# Patient Record
Sex: Male | Born: 1980 | Race: White | Hispanic: No | Marital: Single | Smoking: Former smoker
Health system: Southern US, Community
[De-identification: ages and names within clinical notes are randomized; demographics above are authoritative.]

## PROBLEM LIST (undated history)

## (undated) DIAGNOSIS — F1020 Alcohol dependence, uncomplicated: Secondary | ICD-10-CM

## (undated) DIAGNOSIS — I10 Essential (primary) hypertension: Secondary | ICD-10-CM

## (undated) DIAGNOSIS — F32A Depression, unspecified: Secondary | ICD-10-CM

## (undated) DIAGNOSIS — S069X9A Unspecified intracranial injury with loss of consciousness of unspecified duration, initial encounter: Secondary | ICD-10-CM

## (undated) DIAGNOSIS — F329 Major depressive disorder, single episode, unspecified: Secondary | ICD-10-CM

## (undated) HISTORY — PX: SPINE SURGERY: SHX786

## (undated) HISTORY — DX: Essential (primary) hypertension: I10

## (undated) HISTORY — DX: Major depressive disorder, single episode, unspecified: F32.9

## (undated) HISTORY — DX: Depression, unspecified: F32.A

## (undated) HISTORY — DX: Unspecified intracranial injury with loss of consciousness of unspecified duration, initial encounter: S06.9X9A

## (undated) HISTORY — PX: CERVICAL SPINE SURGERY: SHX589

---

## 2002-09-03 ENCOUNTER — Encounter: Payer: Self-pay | Admitting: Emergency Medicine

## 2002-09-03 ENCOUNTER — Emergency Department (HOSPITAL_COMMUNITY): Admission: EM | Admit: 2002-09-03 | Discharge: 2002-09-03 | Payer: Self-pay | Admitting: Emergency Medicine

## 2002-09-09 ENCOUNTER — Ambulatory Visit (HOSPITAL_COMMUNITY): Admission: RE | Admit: 2002-09-09 | Discharge: 2002-09-09 | Payer: Self-pay | Admitting: Neurology

## 2002-10-27 ENCOUNTER — Encounter: Payer: Self-pay | Admitting: Emergency Medicine

## 2002-10-27 ENCOUNTER — Emergency Department (HOSPITAL_COMMUNITY): Admission: EM | Admit: 2002-10-27 | Discharge: 2002-10-28 | Payer: Self-pay | Admitting: Emergency Medicine

## 2003-07-25 DIAGNOSIS — S069X9A Unspecified intracranial injury with loss of consciousness of unspecified duration, initial encounter: Secondary | ICD-10-CM

## 2003-07-25 DIAGNOSIS — S069XAA Unspecified intracranial injury with loss of consciousness status unknown, initial encounter: Secondary | ICD-10-CM

## 2003-07-25 HISTORY — DX: Unspecified intracranial injury with loss of consciousness of unspecified duration, initial encounter: S06.9X9A

## 2003-07-25 HISTORY — DX: Unspecified intracranial injury with loss of consciousness status unknown, initial encounter: S06.9XAA

## 2003-08-08 ENCOUNTER — Inpatient Hospital Stay (HOSPITAL_COMMUNITY): Admission: AC | Admit: 2003-08-08 | Discharge: 2003-09-21 | Payer: Self-pay

## 2003-09-21 ENCOUNTER — Inpatient Hospital Stay (HOSPITAL_COMMUNITY)
Admission: RE | Admit: 2003-09-21 | Discharge: 2003-11-20 | Payer: Self-pay | Admitting: Physical Medicine & Rehabilitation

## 2003-11-24 ENCOUNTER — Encounter
Admission: RE | Admit: 2003-11-24 | Discharge: 2004-02-22 | Payer: Self-pay | Admitting: Physical Medicine & Rehabilitation

## 2003-12-03 ENCOUNTER — Ambulatory Visit (HOSPITAL_COMMUNITY)
Admission: RE | Admit: 2003-12-03 | Discharge: 2003-12-03 | Payer: Self-pay | Admitting: Physical Medicine & Rehabilitation

## 2003-12-11 ENCOUNTER — Ambulatory Visit (HOSPITAL_COMMUNITY)
Admission: RE | Admit: 2003-12-11 | Discharge: 2003-12-11 | Payer: Self-pay | Admitting: Physical Medicine & Rehabilitation

## 2003-12-25 ENCOUNTER — Encounter
Admission: RE | Admit: 2003-12-25 | Discharge: 2004-03-24 | Payer: Self-pay | Admitting: Physical Medicine & Rehabilitation

## 2004-02-02 ENCOUNTER — Ambulatory Visit (HOSPITAL_COMMUNITY)
Admission: RE | Admit: 2004-02-02 | Discharge: 2004-02-02 | Payer: Self-pay | Admitting: Physical Medicine & Rehabilitation

## 2004-02-12 ENCOUNTER — Ambulatory Visit (HOSPITAL_COMMUNITY)
Admission: RE | Admit: 2004-02-12 | Discharge: 2004-02-12 | Payer: Self-pay | Admitting: Physical Medicine & Rehabilitation

## 2004-02-23 ENCOUNTER — Encounter
Admission: RE | Admit: 2004-02-23 | Discharge: 2004-02-25 | Payer: Self-pay | Admitting: Physical Medicine & Rehabilitation

## 2004-04-07 ENCOUNTER — Encounter
Admission: RE | Admit: 2004-04-07 | Discharge: 2004-06-01 | Payer: Self-pay | Admitting: Physical Medicine & Rehabilitation

## 2004-04-08 ENCOUNTER — Ambulatory Visit: Payer: Self-pay | Admitting: Physical Medicine & Rehabilitation

## 2004-06-01 ENCOUNTER — Encounter
Admission: RE | Admit: 2004-06-01 | Discharge: 2004-08-30 | Payer: Self-pay | Admitting: Physical Medicine & Rehabilitation

## 2004-06-10 ENCOUNTER — Ambulatory Visit (HOSPITAL_COMMUNITY): Admission: RE | Admit: 2004-06-10 | Discharge: 2004-06-10 | Payer: Self-pay | Admitting: Orthopedic Surgery

## 2004-06-24 ENCOUNTER — Encounter: Admission: RE | Admit: 2004-06-24 | Discharge: 2004-09-22 | Payer: Self-pay | Admitting: Orthopedic Surgery

## 2004-08-05 ENCOUNTER — Ambulatory Visit: Payer: Self-pay | Admitting: Physical Medicine & Rehabilitation

## 2004-09-29 ENCOUNTER — Encounter
Admission: RE | Admit: 2004-09-29 | Discharge: 2004-12-28 | Payer: Self-pay | Admitting: Physical Medicine & Rehabilitation

## 2004-10-03 ENCOUNTER — Ambulatory Visit: Payer: Self-pay | Admitting: Physical Medicine & Rehabilitation
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
## 2005-02-06 ENCOUNTER — Encounter
Admission: RE | Admit: 2005-02-06 | Discharge: 2005-05-07 | Payer: Self-pay | Admitting: Physical Medicine & Rehabilitation

## 2005-02-06 ENCOUNTER — Ambulatory Visit: Payer: Self-pay | Admitting: Physical Medicine & Rehabilitation

## 2005-05-08 ENCOUNTER — Encounter
Admission: RE | Admit: 2005-05-08 | Discharge: 2005-08-06 | Payer: Self-pay | Admitting: Physical Medicine & Rehabilitation

## 2005-05-08 ENCOUNTER — Ambulatory Visit: Payer: Self-pay | Admitting: Physical Medicine & Rehabilitation

## 2005-08-01 ENCOUNTER — Ambulatory Visit: Payer: Self-pay | Admitting: Physical Medicine & Rehabilitation

## 2005-11-27 ENCOUNTER — Encounter
Admission: RE | Admit: 2005-11-27 | Discharge: 2006-02-25 | Payer: Self-pay | Admitting: Physical Medicine & Rehabilitation

## 2005-12-01 ENCOUNTER — Ambulatory Visit: Payer: Self-pay | Admitting: Physical Medicine & Rehabilitation

## 2006-02-18 ENCOUNTER — Emergency Department (HOSPITAL_COMMUNITY): Admission: EM | Admit: 2006-02-18 | Discharge: 2006-02-18 | Payer: Self-pay | Admitting: Emergency Medicine

## 2006-05-21 ENCOUNTER — Encounter
Admission: RE | Admit: 2006-05-21 | Discharge: 2006-08-19 | Payer: Self-pay | Admitting: Physical Medicine & Rehabilitation

## 2006-05-21 ENCOUNTER — Ambulatory Visit: Payer: Self-pay | Admitting: Physical Medicine & Rehabilitation

## 2006-08-27 ENCOUNTER — Encounter
Admission: RE | Admit: 2006-08-27 | Discharge: 2006-11-25 | Payer: Self-pay | Admitting: Physical Medicine & Rehabilitation

## 2006-08-27 ENCOUNTER — Ambulatory Visit: Payer: Self-pay | Admitting: Psychology

## 2006-11-02 ENCOUNTER — Encounter
Admission: RE | Admit: 2006-11-02 | Discharge: 2007-01-31 | Payer: Self-pay | Admitting: Physical Medicine & Rehabilitation

## 2006-11-28 ENCOUNTER — Ambulatory Visit: Payer: Self-pay | Admitting: Physical Medicine & Rehabilitation

## 2008-01-16 ENCOUNTER — Encounter
Admission: RE | Admit: 2008-01-16 | Discharge: 2008-01-16 | Payer: Self-pay | Admitting: Physical Medicine & Rehabilitation

## 2009-04-05 ENCOUNTER — Encounter
Admission: RE | Admit: 2009-04-05 | Discharge: 2009-04-06 | Payer: Self-pay | Admitting: Physical Medicine & Rehabilitation

## 2009-04-06 ENCOUNTER — Ambulatory Visit: Payer: Self-pay | Admitting: Physical Medicine & Rehabilitation

## 2010-02-28 ENCOUNTER — Emergency Department (HOSPITAL_COMMUNITY): Admission: EM | Admit: 2010-02-28 | Discharge: 2010-02-28 | Payer: Self-pay | Admitting: Family Medicine

## 2010-03-14 ENCOUNTER — Emergency Department (HOSPITAL_COMMUNITY): Admission: EM | Admit: 2010-03-14 | Discharge: 2010-03-14 | Payer: Self-pay | Admitting: Family Medicine

## 2010-06-07 ENCOUNTER — Emergency Department (HOSPITAL_COMMUNITY)
Admission: EM | Admit: 2010-06-07 | Discharge: 2010-06-07 | Payer: Self-pay | Source: Home / Self Care | Admitting: Emergency Medicine

## 2010-06-17 ENCOUNTER — Emergency Department (HOSPITAL_COMMUNITY): Admission: EM | Admit: 2010-06-17 | Discharge: 2010-06-17 | Payer: Self-pay | Admitting: Family Medicine

## 2010-07-12 ENCOUNTER — Emergency Department (HOSPITAL_COMMUNITY)
Admission: EM | Admit: 2010-07-12 | Discharge: 2010-07-12 | Payer: Self-pay | Source: Home / Self Care | Admitting: Emergency Medicine

## 2010-07-17 ENCOUNTER — Emergency Department (HOSPITAL_COMMUNITY)
Admission: EM | Admit: 2010-07-17 | Discharge: 2010-07-17 | Payer: Self-pay | Source: Home / Self Care | Admitting: Emergency Medicine

## 2010-07-20 ENCOUNTER — Emergency Department (HOSPITAL_COMMUNITY)
Admission: EM | Admit: 2010-07-20 | Discharge: 2010-07-20 | Payer: Self-pay | Source: Home / Self Care | Admitting: Emergency Medicine

## 2010-08-13 ENCOUNTER — Emergency Department (HOSPITAL_COMMUNITY)
Admission: EM | Admit: 2010-08-13 | Discharge: 2010-08-13 | Payer: Self-pay | Source: Home / Self Care | Admitting: Emergency Medicine

## 2010-08-14 ENCOUNTER — Encounter: Payer: Self-pay | Admitting: Physical Medicine & Rehabilitation

## 2010-08-28 ENCOUNTER — Emergency Department (HOSPITAL_COMMUNITY)
Admission: EM | Admit: 2010-08-28 | Discharge: 2010-08-28 | Disposition: A | Discharge status: Home / Self Care | Payer: Medicare Other | Attending: Emergency Medicine | Admitting: Emergency Medicine

## 2010-08-28 DIAGNOSIS — G8929 Other chronic pain: Secondary | ICD-10-CM | POA: Insufficient documentation

## 2010-08-28 DIAGNOSIS — M79609 Pain in unspecified limb: Secondary | ICD-10-CM | POA: Insufficient documentation

## 2010-08-28 DIAGNOSIS — Z87820 Personal history of traumatic brain injury: Secondary | ICD-10-CM | POA: Insufficient documentation

## 2010-08-28 DIAGNOSIS — F329 Major depressive disorder, single episode, unspecified: Secondary | ICD-10-CM | POA: Insufficient documentation

## 2010-08-28 DIAGNOSIS — M549 Dorsalgia, unspecified: Secondary | ICD-10-CM | POA: Insufficient documentation

## 2010-08-28 DIAGNOSIS — F3289 Other specified depressive episodes: Secondary | ICD-10-CM | POA: Insufficient documentation

## 2010-10-26 ENCOUNTER — Encounter: Payer: Medicare Other | Attending: Physical Medicine & Rehabilitation | Admitting: Physical Medicine & Rehabilitation

## 2010-10-26 DIAGNOSIS — S069XAA Unspecified intracranial injury with loss of consciousness status unknown, initial encounter: Secondary | ICD-10-CM | POA: Insufficient documentation

## 2010-10-26 DIAGNOSIS — S069X9A Unspecified intracranial injury with loss of consciousness of unspecified duration, initial encounter: Secondary | ICD-10-CM

## 2010-10-26 DIAGNOSIS — X58XXXA Exposure to other specified factors, initial encounter: Secondary | ICD-10-CM | POA: Insufficient documentation

## 2010-10-27 NOTE — Assessment & Plan Note (Addendum)
Troy [name] is back regarding his brain injury and more specifically about the paperwork from the [facility].  He has been doing well for the most part.  He is not working any longer, but is working on finding new employment. His marriage did not last unfortunately.  He has been completely functional from a cognitive and emotional standpoint.  He is walking and moving without any restrictions.  Denies any pain.  REVIEW OF SYSTEMS:  In the written health and history section of the chart.  SOCIAL HISTORY:  The patient is single, currently living with parents.  PHYSICAL EXAMINATION:  VITAL SIGNS:  Blood pressure is 114/76, pulse 72, respiratory rate 18, he is satting 98% on room air. GENERAL:  The patient is pleasant, alert, and oriented x3. NEUROLOGIC:  Gait is stable and improved from a standpoint of width and stride length.  Fine motor coordination is near normal.  Cranial nerve exam is intact.  Voice remains somewhat nasal. HEART:  Regular. CHEST:  Clear. ABDOMEN:  Soft, nontender.  ASSESSMENT:  Status post traumatic brain injury with nice recovery.  PLAN:  I filled out the patient's DMV paperwork today, but this form is no longer indicated in my opinion.  We will see him back as needed.     Ranelle Oyster, M.D. Electronically Signed    ZTS/MedQ D:  10/26/2010 10:51:59  T:  10/27/2010 00:54:56  Job #:  [id number]

## 2010-12-09 NOTE — Assessment & Plan Note (Signed)
MEDICAL RECORD NUMBER:  [id number].   Troy [name] is back regarding his traumatic brain injury.  He has begun some work  at [employer].  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  He is  apparently getting little feedback from his job coach or the store.  Patient  still is having some difficulties with balance.  He feels a bit uneven  especially on difficult to navigate surfaces.  Sleep is better with the  trazodone.  Still having some problems with memory and attention.  Particularly, he complains of poor attention today.  He was deemed mentally  competent by the court.  He is awaiting word on his driving ability.   REVIEW OF SYSTEMS:  Patient reports weakness, tremor, trouble walking,  spasms, depression which is more of an apathy then true depression.  He has  occasional shortness of breath.  He is not on a true exercise program.  He  is at a half-way house currently and doing well.  He has completed the  requirements of his drug rehab.   SOCIAL HISTORY:  Pertinent positives listed above.   PHYSICAL EXAMINATION:  GENERAL APPEARANCE:  Patient is pleasant and in no  acute distress.  He is alert and oriented x3.  Affect generally bright.  VITAL SIGNS:  Blood pressure 125/80, pulse 104, respiratory rate 16, sating  100% on room air.  HEENT:  Voice remains nasal in quality but is very intelligible.  CARDIOVASCULAR:  Regular rhythm.  CHEST:  Clear.  NEUROLOGIC:  Gait is improved with better coordination of the left side.  His hips tend to jerk a certain extent, but this is the most fluid I have  seen him yet.  Coordination is fair.  Reflexes are 2++.  Sensation is  normal.  He has some problems with his visual acuity in close range but is  intact when viewing from  afar.  Memory and attention were fair today.  Motor  skills were near 5/5 both upper and lower extremities.   ASSESSMENT:  1.  Traumatic brain injury.  2.  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  3.  History of polysubstance abuse.  4.  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      Patient sees better with prism lenses.  5.  Insomnia.  6.  Depression.   PLAN:  1.  I would like to see [place] continue with his job work.  I would be happy      to speak with store if there are any issues with them understanding his      injury.  2.  Await clearance from the court to driving. I think I would prefer him      taking a driving test at [facility] before he goes      behind the wheel.  3.  Continue Effexor 150 mg daily.  4.  He may continue low dose trazodone 25 to 50 mg nightly for rest.  5.  For poor attention, will begin Strattera 40 mg daily.  6.  Will see the patient back in about one month's time to assess progress.      We did discuss appropriate exercise in  training regimen which could include pool, walking and swimming as well      as treadmill and Nautilus work-out.      Ranelle Oyster, M.D.  Electronically Signed     ZTS/MedQ  D:  05/08/2005 14:52:12  T:  05/08/2005 16:28:50  Job #:  [id number]
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
## 2010-12-09 NOTE — Assessment & Plan Note (Signed)
MEDICAL RECORD #[id number]   Troy [name] is here in follow-up of his traumatic brain injury. He continues to  progress. He is in a group home currently. He went through another course of  rehab over the summer with fair results. 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  He is not driving at this point and he is awaiting permission to drive from  the courts. The patient has some pain in the low back due to chronic bending  at work which usually resolves on his days off. He will work anywhere from  15-20+ hours a week. We tried to increase his Effexor last visit and he had  some problems tolerating this. His family doctor increased it again a few  weeks back. He seems to be doing pretty well now. He is using 50-100 mg of  trazodone at nighttime for sleep as well.   REVIEW OF SYSTEMS:  The patient reports problems with mood and walking, as  well as occasional chills. Full review of systems is in the health and  history section of the chart.   SOCIAL HISTORY:  The patient is single. He was in alcohol rehab for 28 days.  He is planning on moving into an apartment soon once he can drive and get  back to normal work. 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   PHYSICAL EXAMINATION:  VITAL SIGNS:  Blood pressure is 121/67, pulse is 79,  respiratory rate is 16, he is saturating 100% on room air.  GENERAL:  The patient is pleasant, no acute distress. He is alert and  oriented x3. His affect is appropriate and gait slightly wide-based with  some favoring of the right leg. Coordination is fair. Reflexes are 2+.  Sensation is normal. He has a nasal voice still but is intelligible. Mood is  generally appropriate. Strength is near 5/5 bilaterally.  SKIN:  Intact.  HEART:  Regular rate and rhythm.  LUNGS:  Clear.  ABDOMEN:  Soft and nontender.  EXTREMITIES:  Show no clubbing, cyanosis, or edema.   ASSESSMENT:  1.  Traumatic brain injury.  2.  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  3.  History of polysubstance abuse.  4.  Diplopia and cranial nerve III palsy.  5.  Insomnia.  6.  Depression.   PLAN:  1.  Continue transitional work at this point. 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 I will be happy to      help him in any way we can there.  2.  Once he is cleared from a legal standpoint and by Dr. Karleen Hampshire visually      for driving, will send him for driving testing. If he passes that, he      will resume driving.  3.  Continue Effexor 150 mg daily.  4.  Decrease trazodone to off.  5.  I will see the patient back in about 3 months' time. He continues to      progress nicely.       ZTS/MedQ  D:  02/08/2005 12:36:24  T:  02/08/2005 14:41:38  Job #:  [id number]

## 2010-12-09 NOTE — Assessment & Plan Note (Signed)
CHIEF COMPLAINT:  Status post history of traumatic brain injury.   He continues to do very well.  He has come off the Strattera at times  without any noticeable changes.  He is still taking it as prescribed,  but has run out of it on a few occasions, and noticed no difference.  He  is using trazodone for sleep as well as the Effexor.  He works part time  as a Financial controller and attends his [employer] regularly.  He denies any  pain.  His mood has been excellent.  No new changes are noted today.  He  recently had neuropsychological testing done through Dr. Leonides Cave and did  very well.   REVIEW OF SYMPTOMS:  Notable for occasional spasms.  Otherwise,  pertinent positives listed above, full review in the written health and  history section of the chart.   SOCIAL HISTORY:  The patient lives alone and is single.  He smokes one  pack of cigarettes per day.   PHYSICAL EXAMINATION:  VITAL SIGNS:  Blood pressure 131/66, pulse 92,  respirations 16 and saturations 100% on room air.  GENERAL:  The patient is pleasant, alert and oriented x3.  Affect is  bright and appropriate.  NEUROLOGIC:  Gait is slightly wide based and a bit choppy still, but  improved.  Coordination is fair.  Reflexes are 2+.  Sensation is good.  Cognitively the patient is appropriate with good insight, awareness,  memory, attention, decision making function, etc.  Cranial nerve exam is  intact, although the patient's voice remains a bit nasal still.  HEART:  Regular rate.  CHEST:  Clear.  ABDOMEN:  Soft and nontender.   ASSESSMENT:  1. Status post traumatic brain injury.  2. Cognitive deficits secondary to number one.  3. History of poly substance abuse.  4. History of diplopia.  5. Insomnia.  6. Depression.   PLAN:  1. Continue Effexor 150 mg q.day for now.  2. Stop Strattera.  3. Taper off trazodone over the next few weeks to months time.  May      benefit from over the counter supplements such as melatonin.  4. I  will see the patient back on an as needed basis in the future.      He has done a very job through this long ordeal.      Ranelle Oyster, M.D.  Electronically Signed     ZTS/MedQ  D:  11/30/2006 11:24:47  T:  11/30/2006 11:57:33  Job #:  [id number]
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
## 2010-12-09 NOTE — Assessment & Plan Note (Signed)
DATE OF VISIT:  October 03, 2004.   MEDICAL RECORD NUMBER:  [id number].   Troy [name] is back regarding his traumatic brain injury.  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  He is bored with this job already.  Sleep has been fair.  He is off of the Ritalin and doing fairly well.  The  patient saw Dr. Karleen Hampshire, who felt that he had a cranial nerve III palsy.  I  do not have the results of his visit in the chart at this point.  Plastic  surgery recommended conservative management at this point for his scalp.  His Effexor has been stable at 75 mg daily.  Troy [name] word from the court  regarding reinstatement of his competency.  He apparently needs a letter  from myself and another doctor in his favor.  His OT driving evaluation  pends this court decision.  I had a chance to review the neuropsychological  testing done through disability services.  The patient tested out at  borderline intelligence level.  He had more problems with his performance  testing more so than verbal.  He also had more problems with visual memory  than auditory.  I explained the results to a certain extent with the patient  and his mother.  The patient overall is feeling fairly well at this point.  His mother asked about whether he may be appropriate to live on his own  sometime soon.   REVIEW OF SYSTEMS:  Pertinent positives are listed above.  No changes  reported.   SOCIAL HISTORY:  Noted above as well.   PHYSICAL EXAMINATION:  VITAL SIGNS:  The blood pressure is 117/75, the pulse  is 84 and the respiratory rate is 16.  He is saturating 99% on room air.  GENERAL APPEARANCE:  The patient has a slightly wide-based gait.  He tends  to kick the legs out when he walks.  When I cued him to bring the legs  closer in together, he did very nicely with his gait appearance and  efficiency.  The patient had some difficulty walking heel to toe.  Affect  was bright and more demonstrative today.  He made good eye  contact.  The  appearance of his eyes was more appropriate as well.  The patient was well  kept.  HEENT:  The skin is healing nicely on the forehead.  NEUROLOGIC:  The patient has good strength in both upper extremities.  A  minimal tremor was noted today.  The patient was able to ask appropriate  questions.  He did need some cues at times to recall information.  HEART:  Regular rate and rhythm.  CHEST:  Clear.  ABDOMEN:  Soft and nontender.  EXTREMITIES:  No cyanosis, clubbing, or edema.   ASSESSMENT:  1.  Traumatic brain injury.  2.  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  3.  History of polysubstance abuse.  4.  Diplopia and cranial nerve III palsy.  5.  Insomnia.  6.  Depression.   PLAN:  1.  Troy [name] has done very well to this point.  We discussed once again that      this will be a slow process.  I would like to see him work on some of      his vocational skills.  He is not appropriate to be gainfully employed      at this time.  I do think he is appropriate for long-term disability.  2.  Could consider followup neuropsychological testing next fall.  I would      not recommend that Troy [name] try school at this point as it may be      counterproductive.  3.  I did recommend that Troy [name] try living on his own if that is financially a      possibility.  4.  Recommend that he follow up with Dr. Karleen Hampshire.  5.  Will increase the Effexor to 150 mg daily to help with arousal and mood.      Troy [name] has been a bit frustrated with his living arrangements and lack of      friends and contact with females.  6.  Continue trazodone 100-200 mg q.h.s. for sleep.  7.  I will see Troy [name] back in about three months' time.      ZTS/MedQ  D:  10/03/2004 16:13:19  T:  10/03/2004 18:46:08  Job #:  [id number]
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
## 2010-12-09 NOTE — Op Note (Signed)
[name], Troy                  ACCOUNT NO.:  [id number]   MEDICAL RECORD NO.:  [id number]          PATIENT TYPE:  OIB   LOCATION:  2550                         FACILITY:  [facility]   PHYSICIAN:  Leonides Grills, M.D.     DATE OF BIRTH:  [birth year]   DATE OF PROCEDURE:  06/10/2004  DATE OF DISCHARGE:  06/10/2004                                 OPERATIVE REPORT   PREOPERATIVE DIAGNOSIS:  1.  Left medial talar dome osteochondral lesion.  2.  Left anterolateral ankle impingement.   POSTOPERATIVE DIAGNOSIS:  1.  Left medial talar dome osteochondral lesion.  2.  Left anterolateral ankle impingement.   OPERATION:  1.  Left ankle arthroscopy with extensive debridement.  2.  Left medial talar dome osteochondral lesion debridement and drilling.   ANESTHESIA:  General endotracheal anesthesia.   SURGEON:  Leonides Grills, M.D.   ASSISTANT:  Lianne Cure, P.A.-C.   ESTIMATED BLOOD LOSS:  Minimal.   TOURNIQUET TIME:  None.   COMPLICATIONS:  None.   DISPOSITION:  Stable to the PR.   INDICATIONS FOR PROCEDURE:  This is a 30 year old male who has had  persistent ankle pain that is interfering with his life to the point he  cannot do what he wants to do.  He has consented for the above procedure.  All risks which included infection, neurovascular injury, persistent pain,  worsening pain, stiffness, arthritis, and recurrence of mechanical symptoms  were all explained.  Questions were encouraged and answered.   OPERATION:  The patient was brought to the operating room and placed in  supine position.  After adequate general endotracheal anesthesia was  administered as well as Ancef 1 gram IV piggyback, the left lower extremity  was then prepped and draped in a sterile manner.  No tourniquet was used.  The anatomical landmarks were mapped out.  A spinal needle was placed medial  to the anterior tibialis tendon.  20 mL of normal saline was instilled into  the ankle.  A nick and spread  technique was utilized to create the  anteromedial portal just medial to the anterior tibialis tendon.  The blunt  tip trocar with the cannula followed by camera was then placed into the  ankle under direct visualization.  A spinal needle was placed just lateral  to the superficial peroneal nerve which was mapped out.  A nick and spread  technique was utilized to create the anterolateral portal.  There was a  large amount of synovitis in the ankle.  The accessory tib-fib ligament is  rubbing the anterolateral corner of the talar dome and there was local  synovitis in this area, as well, and this extended anteriorly and medially.  There was also a tremendous amount of synovitis anteromedially.  Using a  bevel as well as a shaver, this was then extensively debrided.  Pictures  were obtained throughout this procedure.  The talar dome osteochondral  lesion has a significant soft spot on the central third of the medial talar  dome, this was then curetted out.  There was a cyst underneath this area.  This was then debrided, as well.  Microfracture was used to create holes in  the base.  Inflow and pressure was deflated, there was excellent bleeding  from the base.  The camera was removed, pictures were taken throughout the  procedure.  The wounds were closed with 4-0 nylon.  A sterile dressing was  applied, CAM walker boot was applied, the patient was stable to the PR.      Paul   PB/MEDQ  D:  06/10/2004  T:  06/10/2004  Job:  [id number]
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
## 2010-12-09 NOTE — Op Note (Signed)
Troy [name], Troy [name]                              ACCOUNT NO.:  [id number]   MEDICAL RECORD NO.:  [id number]                   PATIENT TYPE:  INP   LOCATION:  3111                                 FACILITY:  [facility]   PHYSICIAN:  Jimmye Norman III, M.D.               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   PROCEDURE:  1. Esophagogastroduodenoscopy.  2. Percutaneous endoscopic gastrostomy.  3. Bronchoscopy with lavage.  4. Percutaneous tracheostomy with #8 Shiley tracheostomy tube.   SURGEON:  Jimmye Norman, M.D.   ASSISTANT:  Phineas Semen, P.A.   ANESTHESIA:  Done at bedside with IV Fentanyl 200 mcg and Versed 10 mg along  with 20 mg of IV Norcuron.   CONDITION:  Stable; it was done at the bedside in Neuro ICU.   INDICATIONS FOR PROCEDURE:  The patient is an unfortunate 30 year old with a  severe closed head injury who requires prolonged ventilatory and nutritional  support.   DESCRIPTION OF PROCEDURE:  The percutaneous endoscopic gastrostomy tube was  done first along with the esophagogastroduodenoscopy.  Using the Olympus  GIF160 scope, we cannulated the patient's oropharynx into the proximal  esophagus where pictures were taken of what appeared to be a normal  esophagus without evidence of esophagitis.  As we passed into the stomach  and near the pylorus there was some what appeared to be healed superficial  ulcerations.  The NG tube was still in place which was pulled out during the  procedure.   We cannulated the pylorus into the first and second portion of the duodenum  where normal mucosa was seen without evidence of duodenal ulceration or  bleeding.  As we  pulled back into the pylorus and the stomach in the antrum,  that area appeared to be slightly atrophic and maybe edematous with some  healed scars.   We pulled the scope back through the proximal stomach where upon palpating  the anterior abdominal wall we were able to see the Impulse and pass a 14  gauge Angiocath into the stomach under direct vision.  It was through the  Angiocath that we grasped a loop of wire which was brought through the  patient's mouth and subsequently looped around the port through the  gastrostomy tube which was subsequently pulled back through the patient's  mouth, out the anterior abdominal wall and secured in place.  Pictures were  taken of its position subsequent to placement.   After it was secured and placed, the patient was prepped for a bronchoscopy.   A towel roll was placed underneath the patient's shoulder. We did remove the  anterior  portion of his [place] J collar.  With the patient oxygenated on  100%, we bronchoscoped him and found there to be copious thick secretions on  both sides of the lung.  Mucomyst irrigation and bronchioalveolar lavage  were performed using saline.  We did not send specimens as we have known we  had been treating an MRSA pneumonia.  This appeared to be the same.  We  aspirated large amounts of pus and subsequently removed the scope. We then  prepped him with Betadine in the usual sterile manner for the tracheostomy  tube.   We anesthetized the area of about 1 cm above the sternal notch using 1%  Xylocaine with a 25 gauge needle. We made a transverse incision about 2.0 to  2.5 to 3.0 cm long down to the subcutaneous tissue and then mostly used  electrocautery and blunt dissection with the hemostat clamp to dissect down  to the pretracheal fascia at the level of the first, second and third  tracheal rings.  Once we had adequate exposure in that area with [employer]  retractors in place, we punctured between the second and  third tracheal  rings using an Angiocath, 14 gauge and then subsequently passed a green wire  from the blue Rhino kit into the tracheotomy.  We enlarged it with a short  rigid dilator and then subsequently the large blue Rhino dilator up to the  black line of the serial dilator. We subsequently removed that and used a 28  French introducer and dilator inside of a #8 Shiley tube and then passed  that into the tracheotomy with minimal difficulty.  A __________ cannula was  placed and the ballon of the tracheostomy tube was inflated which  subsequently demonstrated there was an excellent flow of gas and good  return.   We secured the tracheostomy tube in place using four corner stitches of 3-0  nylon with the dressing in place.  Once this was done we let the patient  wake up from the procedure, removed all the devices including the ET tube  from his oropharynx.                                               Kathrin Ruddy, M.D.    JW/MEDQ  D:  08/19/2003  T:  08/19/2003  Job:  [id number]

## 2010-12-09 NOTE — Consult Note (Signed)
Troy [name], Troy [name]                            ACCOUNT NO.:  [id number]   MEDICAL RECORD NO.:  [id number]                   PATIENT TYPE:  EMS   LOCATION:  MAJO                                 FACILITY:  [facility]   PHYSICIAN:  Genene Churn. Love, M.D.                 DATE OF BIRTH:  1980-07-25   DATE OF CONSULTATION:  09/03/2002  DATE OF DISCHARGE:                                   CONSULTATION   REASON FOR CONSULTATION:  This 30 year old right-handed white single male,  who lives with his mother and father, is seen in the emergency room for  evaluation of a seizure.   HISTORY OF PRESENT ILLNESS:  The patient was the 7 pound 11-1/2 ounce  product of full-term pregnancy, delivered by C-section.  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  His motor and cognitive  developed milestones were normal.  Prior to age 44, he had two witnessed less  than two minute generalized major motor seizures occurring with fevers.  There is no positive family history of seizures.  He subsequently has had no  seizures until this day.  He had eaten some cinnamon bun for breakfast and  was at a [employer] waiting for his car to be finished with its lubrication.  He had taken his Levaquin medicine given for left arm cellulitis and noted  nausea and vomiting.  He threw up in the restroom.  He went back to the  waiting room and returned to the couch.  He lost consciousness without aura.  He had a seizure, was shaking all over for two to four minutes, falling  forward without hurting himself.  He was confused afterwards.   The EMT service arrived.  He had a normal glucose and he refused to go to  the emergency room but his mother subsequently came and he was brought to  the emergency room.  He had three episodes of emesis afterwards and also had  some bifrontal headache.   In the emergency room, his headache resolved.  CT scan of the brain was  obtained without contrast which was normal and  sodium was 143, potassium  3.8, chloride 105, CO2 27, glucose 198, hemoglobin 13, and hematocrit 53.  An EKG was normal.  A pH was 7.286, pCO2 of 52.2.   PAST MEDICAL HISTORY:  His past history is significant for recently  diagnosed hypertension, anxiety, and depression.   MEDICATIONS:  1. Atenolol.  2. Klonopin 2 mg.  3. Prozac 20 mg.  4. Levaquin secondary to a recently bug bite on his left wrist.  5. Advil p.r.n.   FAMILY HISTORY:  Unremarkable.   PHYSICAL EXAMINATION:  GENERAL:  A well-developed, pleasant white male.  VITAL SIGNS:  Blood pressure 120/60, heart rate of 64.  He was afebrile.  EXTREMITIES:  He had a bug bite to his left  forearm.  NEUROLOGICAL:  He was alert and oriented x 3.  Followed one, two, and three  step commands.  His cranial nerve examination revealed visual fields full.  Disk flat.  Spontaneous venous pulsations.  Extraocular movements full.  Cornea present.  Facial sensation equal.  No facial motor asymmetry.  Hearing present.  Air conduction greater than bone conduction.  Tongue  midline.  Uvula midline.  Gags present.  Sternocleidomastoid and trapezius  testing.  All motor examination 5/5 strength proximally and distally in the  upper and lower extremities without any evidence of proximal pronator or  distal drift.  Coordination testing normal outstretched hand.  Sensory  examination intact to pinprick, sternal position, and vibration testing.  Deep tendon reflexes 1 to 2+ with downgoing plantar responses.  Gait  unremarkable.   IMPRESSION:  1. Single seizure, probably a provoked seizure related to Levaquin, code     345.10.  2. Tremor, code 233.1 with family history of tremor.  3. Depression, code 211.  4. Anxiety, code 300.0.  5. History of hypertension, code 796.2.  6. Bug bite to his left forearm.   PLAN:  Change him to Keflex 500 mg q.i.d. x 5 days.  Discontinue Levaquin.  Place him on seizure precautions.  Obtain a CT and EEG and I asked  him not  to drive a car.                                               Genene Churn. Sandria Manly, M.D.    JML/MEDQ  D:  09/03/2002  T:  09/03/2002  Job:  [id number]   cc:   [name], M.D.  [address], [address]  [place], [place] [address]  Fax: [phone]

## 2010-12-09 NOTE — Assessment & Plan Note (Signed)
HISTORY:  Troy [name] is back regarding his traumatic brain injury.  He is doing  quite well.  He is still working on expanding his hours at work.  He is  actually looking at other work as they seem to be unsuccessfully adjusting  his schedule for him.  He is living at his own house currently, which his  parents helped to set him up with. He  denies minimal pain except for  occasional pain in the left ankle.  Cognition is much improved.  He remains  on a Strattera 80 mg a day.   REVIEW OF SYSTEMS:  On the review of systems the patient denies any pressure  and anxiety.  He feels like his memory  is much improved and his cognition  is likely better that it ever has been.   SOCIAL HISTORY:  The patient has remained sober.  He lives alone as  mentioned above.   PHYSICAL EXAMINATION:  VITAL SIGNS:  Blood pressure is 125/75, pulse is 101,  respiratory rate 16 and he is sating a hundred percent on room air.  GENERAL APPEARANCE: The patient is pleasant and in no acute distress.  He  alert and oriented times three.  HEENT:  Exam is appropriate.  NEUROLOGIC EXAMINATION:  Gait is stable and symmetric.  Coordination is  excellent in the upper extremities, except for a fine tremor.  Speech  remains nasal, but intelligible.  Memory, attention and awareness are well-  nourished. Motor function is 5/5.  Reflexes are 2+.   ASSESSMENT:  1. Status post traumatic brain injury.  2. 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 Polysubstance abuse.  4. Diplopia.  5. Insomnia.  6. Depression.   PLAN:  1. Continue Effexor 150 mg daily for now.  2. Strattera prescription will decreased to 40 mg daily; we will see if we      can reduce this further over the next few months' time.  3. Continue work and vocational activities.  4. I will see him back in another six months.   The patient is doing extremely well at this time.      Troy [name], M.D.  Electronically Signed     ZTS/MedQ  D:   05/22/2006 13:37:56  T:  05/22/2006 21:41:16  Job #:  [id number]

## 2010-12-09 NOTE — Assessment & Plan Note (Signed)
MEDICAL RECORD NUMBER:  [id number].   Troy [name] is here in followup of his severe traumatic brain injury and associated  functional deficits. He was discharged from rehab on November 18, 2003. The  patient has generally been doing well. Mom notes some flat affect, but for  the most part, behavior has been acceptable. He is sleeping very well at  night with the trazodone without hangover effect. Troy [name] seems to be withdrawn  a bit from the standpoint of his social reintegration. He is not hanging  around with his friends from before as they were a bad influence. His  breathing has been fine. He does report some difficulty swallowing although  he has been cleared for a regular diet, taking a very small sips of thin  liquids. His biggest complaint is _____________ endurance as well as pain in  the left ankle. We had x-rays done of the left ankle a few weeks back, and  these were negative for obvious fracture. Blurred vision has been a problem,  but the patient has chosen not to use corrective lenses at this point as the  problem is minimal. The patient is still involved in outpatient therapy and  was working on functional skills as well as mobility and balance and  cognition in addition to his swallowing.   The patient reports pain is 6 to 7/10 and mostly at the left ankle. He feels  that the left ankle will roll up on him occasionally. Mother reports some  occasional tightness in both ankles, mostly on the right, with clonus noted.  Ankle pain is worse with walking but then will go away after several  minutes. Pain is also worse in the morning upon awakening.   REVIEW OF SYSTEMS:  The patient denies any chest pain or heart murmur but  does have occasional shortness of breath. He has had no wheezing or  coughing. The patient denies seizures but has some weakness, mild dizziness,  and spasm. Blurred vision as mentioned above. He sleeps well with the  medications. Denies any mood issues. The patient  denies nausea, vomiting,  diarrhea, constipation, urinary symptoms. Has had some occasional fever and  chills and notes excessive sweating in the palms and the feet.   PHYSICAL EXAMINATION:  Blood pressure 128/79, pulse 95, respiratory rate 18.  He is saturating 98% on room air. The patient walks with robotic type gait  pattern and has a heart time just associating movements in the ankles and  feet. He is generally well balance. I did have him bend over and try to roll  up his pants leg, and he almost lost his balance, however. Affect is flat,  but he does some emotion and laugh on occasion. He is well kept. Skin has  healed nicely. On cognitive testing, he could recall the president after a  few seconds. He could not recall the vice president. Knew the date without  cues. He thought that the next holiday coming up was Thanksgiving and had a  hard time remembering the Fourth of July with several cues today. Cranial  nerve exam was grossly intact. The patient's sequencing was poor for numbers  but better with letters and words. Abstract thinking was appropriate today.  Strength was 5/5 in both upper extremity and lower extremities. The patient  had almost full extension of the right elbow, lacking maybe 3 to 4 degrees  overall. Lower extremity strength was 4+ to 5/5. Clonus was noted in both  ankles, and reflexes were hyperactive throughout.  I tried to elicit pain  from the ankle and was unable to do so with resistive movements. Did have  some pain along the posterior portion of the medial malleolus along the  posterior tibialis posterior muscle. This area hurt a bit more with  palpation than with resisted plantar flexion of the foot.   ASSESSMENT:  1. Traumatic brain injury.  2. 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  3. Left ankle pain which appears to be tibialis posterior tendinitis.  4. Attention-initiation deficits.   PLAN:  1. Troy [name] has done very nicely to this point. I recommend  ongoing outpatient     therapies. He can reintegrate into the community as he is comfortable     with this. I do not think that it is imperative at this point. I am happy     to see that the patient realizes that he cannot stay with the crowd he     was running with prior to his injury.  2. For sleep, the patient will continue with his trazodone q.h.s.  3. For his left ankle pain, we will start him on a trial of Relafen 500 mg     q.d. The patient also will try ice daily to the foot two to three times a     day. He may also do better with a medial arch support on the left side.     His arches are a bit flat in general. May consider injecting the left     peritendinous area.  4. Will continue with Ritalin for attention initiation.  5. Recommend rest breaks as needed.  6. I will speak with Dr. Leonides [name] regarding any recommended drug and alcohol     counseling. It is probably a little too early to have him address this     aggressively.  7. We will see the patient back in about a month's time.      Troy [name], M.D.   ZTS/MedQ  D:  12/28/2003 11:17:19  T:  12/28/2003 11:59:55  Job #:  [id number]
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
## 2010-12-09 NOTE — Assessment & Plan Note (Signed)
Troy [name] is back regarding his traumatic brain injury.  Denies any new pain.  He  is working 16 to 20 hours a week still at [employer], [place].  He is  wanting more work, and they are trying to oblige him.  Strattera seems to be  helping with his tension.  Sleep is fair with the Trazodone.  He is looking  at getting his own apartment with a roommate, but he has not found a  suitable candidate as of yet.  His mood has been good.  He has had  occasional spasms in the legs, but these are intermittent.  He has a fine  tremor as well.   REVIEW OF SYSTEMS:  The patient has some problems with fluidity of gait.  He  occasionally gets short of breath.  Otherwise, he is stable.  Other  pertinent positives are listed above.  Full 14-point review is in the Health  and History section.   SOCIAL HISTORY:  Pertinent positives listed above.  Patient is celebrating a  year of sobriety today.   PHYSICAL EXAMINATION:  VITAL SIGNS: Blood pressure 120/67, pulse 84,  respiratory rate 16.  The patient is saturating 100% on room air.  GENERAL:  The patient is pleasant, in no acute distress.  He is alert and  oriented x3.  His affect is bright.  Gait is slightly wide based and  shuffling but otherwise stable.  Coordination in his upper extremities is  fair with fine tremor noted. Reflexes are 2+. Sensation is normal.  Memory,  attention, and awareness are great.  He continues to have a nasal quality to  his voice.  Motor function is 5/5.  HEART:  Regular rate and rhythm.  LUNGS: Clear.  ABDOMEN: Soft, nontender.   ASSESSMENT:  1.  Status post traumatic brain injury.  2.  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  3.  History of polysubstance abuse.  4.  Diplopia.  5.  Insomnia.  6.  Depression.   PLAN:  1.  I would like him to stay with Strattera 80 mg daily for now.  He seems      to be doing fairly well with this overall.  He is getting better      attention and better endurance overall.  2.  Encouraged  increased work load as tolerated.  3.  Encouraged step to look at taking a class this summer.  He may want to      try auditing a class in fact. He seems receptive to this, and he will      look into taking a course this summer.  4.  I will see him back in about 6 months' time.  He is doing quite well at      this point.      Ranelle Oyster, M.D.  Electronically Signed     ZTS/MedQ  D:  12/04/2005 13:12:58  T:  12/04/2005 21:56:25  Job #:  [id number]
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
## 2010-12-09 NOTE — Op Note (Signed)
NAMEYACQUB, BASTON                              ACCOUNT NO.:  [id number]   MEDICAL RECORD NO.:  [id number]                   PATIENT TYPE:  INP   LOCATION:  3111                                 FACILITY:  [facility]   PHYSICIAN:  Shan Levans, M.D. [facility]            DATE OF BIRTH:  06-21-1981   DATE OF PROCEDURE:  08/08/2003  DATE OF DISCHARGE:                                 OPERATIVE REPORT   PROCEDURE:  Bronchoscopy.   INDICATIONS FOR PROCEDURE:  Hypoxia and mucus plugging.   OPERATOR:  Shan Levans, M.D. [facility]   ANESTHESIA:  1% Xylocaine local.   PREMEDICATION:  The patient has been sedated and on a ventilator.   DESCRIPTION OF PROCEDURE:  The bedside portable bronchoscope was introduced  through the endotracheal tube. Thick, tenacious mucus plugs were seen in the  lower lung zones. These were suctioned free. There were no intrabronchial  lesions. No evidence of tracheal or bronchial disruption seen. Complication  was desaturation at the end of the procedure, requiring right chest tube  placement.   IMPRESSION:  Bilateral pulmonary contusion with aspirated material in the  lower lung zones.   RECOMMENDATIONS:  Continue vigorous pulmonary  toilet and ventilatory  support.                                               Shan Levans, M.D. [facility]    PW/MEDQ  D:  08/08/2003  T:  08/08/2003  Job:  [id number]   cc:   Jimmye Norman III, M.D.  [address]  [place]  [place] [address]  Fax: [phone]
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
## 2010-12-09 NOTE — Discharge Summary (Signed)
[name], Troy [name]                              ACCOUNT NO.:  [id number]   MEDICAL RECORD NO.:  [id number]                   PATIENT TYPE:  IPS   LOCATION:  4001                                 FACILITY:  [facility]   PHYSICIAN:  Ranelle Oyster, M.D.             DATE OF BIRTH:  [birth year]   DATE OF ADMISSION:  09/21/2003  DATE OF DISCHARGE:  11/20/2003                                 DISCHARGE SUMMARY   ADDENDUM:  Discharge initially anticipated for April 27 was delayed until  April 29 for family to arrange the full supervision needed at home. During  that hospital course of those remaining two days, the patient maintained  medically stable other than his Ritalin being decreased to 10 mg twice daily  and his trazodone being changed to as needed. All of these issues had been  discussed with family. Discharge plan remained the same, to be discharged to  home with family.      Mariam Dollar, P.A.                     Ranelle Oyster, M.D.    DA/MEDQ  D:  11/20/2003  T:  11/20/2003  Job:  [id number]

## 2012-06-12 ENCOUNTER — Encounter: Payer: Medicare Other | Attending: Physical Medicine & Rehabilitation | Admitting: Physical Medicine & Rehabilitation

## 2012-06-12 ENCOUNTER — Encounter: Payer: Self-pay | Admitting: Physical Medicine & Rehabilitation

## 2012-06-12 VITALS — BP 111/76 | HR 67 | Resp 14 | Ht 67.0 in | Wt 160.0 lb

## 2012-06-12 DIAGNOSIS — F17200 Nicotine dependence, unspecified, uncomplicated: Secondary | ICD-10-CM | POA: Insufficient documentation

## 2012-06-12 DIAGNOSIS — S069X9S Unspecified intracranial injury with loss of consciousness of unspecified duration, sequela: Secondary | ICD-10-CM | POA: Insufficient documentation

## 2012-06-12 DIAGNOSIS — S069XAA Unspecified intracranial injury with loss of consciousness status unknown, initial encounter: Secondary | ICD-10-CM | POA: Insufficient documentation

## 2012-06-12 DIAGNOSIS — X58XXXS Exposure to other specified factors, sequela: Secondary | ICD-10-CM | POA: Insufficient documentation

## 2012-06-12 DIAGNOSIS — S069X9A Unspecified intracranial injury with loss of consciousness of unspecified duration, initial encounter: Secondary | ICD-10-CM | POA: Insufficient documentation

## 2012-06-12 DIAGNOSIS — S069XAS Unspecified intracranial injury with loss of consciousness status unknown, sequela: Secondary | ICD-10-CM | POA: Insufficient documentation
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
## 2013-04-17 ENCOUNTER — Encounter (HOSPITAL_COMMUNITY): Payer: Self-pay | Admitting: Emergency Medicine

## 2013-04-17 ENCOUNTER — Emergency Department (HOSPITAL_COMMUNITY)
Admission: EM | Admit: 2013-04-17 | Discharge: 2013-04-18 | Disposition: A | Discharge status: Home / Self Care | Payer: Medicare Other | Attending: Emergency Medicine | Admitting: Emergency Medicine

## 2013-04-17 DIAGNOSIS — F17200 Nicotine dependence, unspecified, uncomplicated: Secondary | ICD-10-CM | POA: Insufficient documentation

## 2013-04-17 DIAGNOSIS — F329 Major depressive disorder, single episode, unspecified: Secondary | ICD-10-CM | POA: Insufficient documentation

## 2013-04-17 DIAGNOSIS — M546 Pain in thoracic spine: Secondary | ICD-10-CM | POA: Insufficient documentation

## 2013-04-17 DIAGNOSIS — Z79899 Other long term (current) drug therapy: Secondary | ICD-10-CM | POA: Insufficient documentation

## 2013-04-17 DIAGNOSIS — F3289 Other specified depressive episodes: Secondary | ICD-10-CM | POA: Insufficient documentation

## 2013-04-17 HISTORY — DX: Alcohol dependence, uncomplicated: F10.20

## 2013-04-17 MED ORDER — IBUPROFEN 200 MG PO TABS
600.0000 mg | ORAL_TABLET | Freq: Once | ORAL | Status: AC
  Administered 2013-04-18: 600 mg via ORAL
  Filled 2013-04-17: qty 3

## 2013-04-17 MED ORDER — CYCLOBENZAPRINE HCL 10 MG PO TABS
5.0000 mg | ORAL_TABLET | Freq: Once | ORAL | Status: AC
  Administered 2013-04-18: 5 mg via ORAL
  Filled 2013-04-17: qty 1

## 2013-04-17 NOTE — ED Notes (Signed)
Pt ambulatory to exam room with steady gait.  
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
## 2013-04-18 MED ORDER — CYCLOBENZAPRINE HCL 5 MG PO TABS: 5.0000 mg | ORAL_TABLET | Freq: Three times a day (TID) | ORAL | Status: DC | PRN

## 2013-04-18 MED ORDER — IBUPROFEN 600 MG PO TABS: 600.0000 mg | ORAL_TABLET | Freq: Three times a day (TID) | ORAL | Status: DC | PRN

## 2013-04-18 NOTE — ED Provider Notes (Signed)
Medical screening examination/treatment/procedure(s) were performed by non-physician practitioner and as supervising physician I was immediately available for consultation/collaboration.    Xylah Early R Daniah Zaldivar, MD 04/18/13 0339 

## 2013-07-08 ENCOUNTER — Encounter: Payer: Self-pay | Admitting: Internal Medicine

## 2013-07-08 DIAGNOSIS — F1021 Alcohol dependence, in remission: Secondary | ICD-10-CM | POA: Insufficient documentation

## 2013-07-08 DIAGNOSIS — F3341 Major depressive disorder, recurrent, in partial remission: Secondary | ICD-10-CM | POA: Insufficient documentation

## 2013-07-08 DIAGNOSIS — F325 Major depressive disorder, single episode, in full remission: Secondary | ICD-10-CM | POA: Insufficient documentation

## 2013-07-08 DIAGNOSIS — I10 Essential (primary) hypertension: Secondary | ICD-10-CM | POA: Insufficient documentation

## 2013-07-09 ENCOUNTER — Ambulatory Visit (INDEPENDENT_AMBULATORY_CARE_PROVIDER_SITE_OTHER): Payer: Medicare Other | Admitting: Internal Medicine

## 2013-07-09 ENCOUNTER — Encounter: Payer: Self-pay | Admitting: Internal Medicine

## 2013-07-09 ENCOUNTER — Other Ambulatory Visit: Payer: Self-pay | Admitting: Internal Medicine

## 2013-07-09 VITALS — BP 112/86 | HR 68 | Temp 99.3°F | Resp 16 | Ht 68.0 in | Wt 159.0 lb

## 2013-07-09 DIAGNOSIS — E782 Mixed hyperlipidemia: Secondary | ICD-10-CM

## 2013-07-09 DIAGNOSIS — Z79899 Other long term (current) drug therapy: Secondary | ICD-10-CM

## 2013-07-09 DIAGNOSIS — L709 Acne, unspecified: Secondary | ICD-10-CM

## 2013-07-09 DIAGNOSIS — Z0000 Encounter for general adult medical examination without abnormal findings: Secondary | ICD-10-CM

## 2013-07-09 DIAGNOSIS — I10 Essential (primary) hypertension: Secondary | ICD-10-CM

## 2013-07-09 DIAGNOSIS — R569 Unspecified convulsions: Secondary | ICD-10-CM

## 2013-07-09 DIAGNOSIS — Z1212 Encounter for screening for malignant neoplasm of rectum: Secondary | ICD-10-CM

## 2013-07-09 DIAGNOSIS — E559 Vitamin D deficiency, unspecified: Secondary | ICD-10-CM

## 2013-07-09 DIAGNOSIS — N529 Male erectile dysfunction, unspecified: Secondary | ICD-10-CM

## 2013-07-09 DIAGNOSIS — R7309 Other abnormal glucose: Secondary | ICD-10-CM

## 2013-07-09 LAB — BASIC METABOLIC PANEL WITH GFR: Creat: 1.05 mg/dL (ref 0.50–1.35)

## 2013-07-09 LAB — HEPATIC FUNCTION PANEL
AST: 18 U/L (ref 0–37)
Albumin: 4.8 g/dL (ref 3.5–5.2)
Alkaline Phosphatase: 39 U/L (ref 39–117)
Bilirubin, Direct: 0.1 mg/dL (ref 0.0–0.3)
Indirect Bilirubin: 0.4 mg/dL (ref 0.0–0.9)
Total Bilirubin: 0.5 mg/dL (ref 0.3–1.2)

## 2013-07-09 LAB — CBC WITH DIFFERENTIAL/PLATELET
Basophils Absolute: 0.1 10*3/uL (ref 0.0–0.1)
Basophils Relative: 1 % (ref 0–1)
Eosinophils Absolute: 0.2 10*3/uL (ref 0.0–0.7)
Lymphs Abs: 1.6 10*3/uL (ref 0.7–4.0)
MCH: 28.6 pg (ref 26.0–34.0)
MCHC: 34.9 g/dL (ref 30.0–36.0)
Monocytes Absolute: 0.4 10*3/uL (ref 0.1–1.0)
Monocytes Relative: 7 % (ref 3–12)
Neutrophils Relative %: 62 % (ref 43–77)

## 2013-07-09 LAB — LIPID PANEL
Cholesterol: 146 mg/dL (ref 0–200)
HDL: 38 mg/dL — ABNORMAL LOW (ref >39)
LDL Cholesterol: 93 mg/dL (ref 0–99)
VLDL: 15 mg/dL (ref 0–40)

## 2013-07-09 MED ORDER — MELOXICAM 15 MG PO TABS: 15.0000 mg | ORAL_TABLET | Freq: Every day | ORAL | Status: DC

## 2013-07-09 MED ORDER — DOXYCYCLINE MONOHYDRATE 100 MG PO CAPS: 100.0000 mg | ORAL_CAPSULE | Freq: Two times a day (BID) | ORAL | Status: DC

## 2013-07-09 MED ORDER — TRAZODONE HCL 150 MG PO TABS: ORAL_TABLET | ORAL | Status: DC
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
## 2013-07-09 NOTE — Patient Instructions (Signed)
Continue diet & medications same as discussed.   Further disposition pending lab results.    Back Pain, Adult Low back pain is very common. About 1 in 5 people have back pain.The cause of low back pain is rarely dangerous. The pain often gets better over time.About half of people with a sudden onset of back pain feel better in just 2 weeks. About 8 in 10 people feel better by 6 weeks.  CAUSES Some common causes of back pain include:  Strain of the muscles or ligaments supporting the spine.  Wear and tear (degeneration) of the spinal discs.  Arthritis.  Direct injury to the back. DIAGNOSIS Most of the time, the direct cause of low back pain is not known.However, back pain can be treated effectively even when the exact cause of the pain is unknown.Answering your caregiver's questions about your overall health and symptoms is one of the most accurate ways to make sure the cause of your pain is not dangerous. If your caregiver needs more information, he or she may order lab work or imaging tests (X-rays or MRIs).However, even if imaging tests show changes in your back, this usually does not require surgery. HOME CARE INSTRUCTIONS For many people, back pain returns.Since low back pain is rarely dangerous, it is often a condition that people can learn to [facility] their own.   Remain active. It is stressful on the back to sit or stand in one place. Do not sit, drive, or stand in one place for more than 30 minutes at a time. Take short walks on level surfaces as soon as pain allows.Try to increase the length of time you walk each day.  Do not stay in bed.Resting more than 1 or 2 days can delay your recovery.  Do not avoid exercise or work.Your body is made to move.It is not dangerous to be active, even though your back may hurt.Your back will likely heal faster if you return to being active before your pain is gone.  Pay attention to your body when you bend and lift. Many people  have less discomfortwhen lifting if they bend their knees, keep the load close to their bodies,and avoid twisting. Often, the most comfortable positions are those that put less stress on your recovering back.  Find a comfortable position to sleep. Use a firm mattress and lie on your side with your knees slightly bent. If you lie on your back, put a pillow under your knees.  Only take over-the-counter or prescription medicines as directed by your caregiver. Over-the-counter medicines to reduce pain and inflammation are often the most helpful.Your caregiver may prescribe muscle relaxant drugs.These medicines help dull your pain so you can more quickly return to your normal activities and healthy exercise.  Put ice on the injured area.  Put ice in a plastic bag.  Place a towel between your skin and the bag.  Leave the ice on for 15-20 minutes, 03-04 times a day for the first 2 to 3 days. After that, ice and heat may be alternated to reduce pain and spasms.  Ask your caregiver about trying back exercises and gentle massage. This may be of some benefit.  Avoid feeling anxious or stressed.Stress increases muscle tension and can worsen back pain.It is important to recognize when you are anxious or stressed and learn ways to manage it.Exercise is a great option. SEEK MEDICAL CARE IF:  You have pain that is not relieved with rest or medicine.  You have pain that does not  improve in 1 week.  You have new symptoms.  You are generally not feeling well. SEEK IMMEDIATE MEDICAL CARE IF:   You have pain that radiates from your back into your legs.  You develop new bowel or bladder control problems.  You have unusual weakness or numbness in your arms or legs.  You develop nausea or vomiting.  You develop abdominal pain.  You feel faint. Document Released: 07/10/2005 Document Revised: 01/09/2012 Document Reviewed: 11/28/2010 [facility] Patient Information 2014 [place],  [place].  Hypertension As your heart beats, it forces blood through your arteries. This force is your blood pressure. If the pressure is too high, it is called hypertension (HTN) or high blood pressure. HTN is dangerous because you may have it and not know it. High blood pressure may mean that your heart has to work harder to pump blood. Your arteries may be narrow or stiff. The extra work puts you at risk for heart disease, stroke, and other problems.  Blood pressure consists of two numbers, a higher number over a lower, 110/72, for example. It is stated as "110 over 72." The ideal is below 120 for the top number (systolic) and under 80 for the bottom (diastolic). Write down your blood pressure today. You should pay close attention to your blood pressure if you have certain conditions such as:  Heart failure.  Prior heart attack.  Diabetes  Chronic kidney disease.  Prior stroke.  Multiple risk factors for heart disease. To see if you have HTN, your blood pressure should be measured while you are seated with your arm held at the level of the heart. It should be measured at least twice. A one-time elevated blood pressure reading (especially in the Emergency Department) does not mean that you need treatment. There may be conditions in which the blood pressure is different between your right and left arms. It is important to see your caregiver soon for a recheck. Most people have essential hypertension which means that there is not a specific cause. This type of high blood pressure may be lowered by changing lifestyle factors such as:  Stress.  Smoking.  Lack of exercise.  Excessive weight.  Drug/tobacco/alcohol use.  Eating less salt. Most people do not have symptoms from high blood pressure until it has caused damage to the body. Effective treatment can often prevent, delay or reduce that damage. TREATMENT  When a cause has been identified, treatment for high blood pressure is directed  at the cause. There are a large number of medications to treat HTN. These fall into several categories, and your caregiver will help you select the medicines that are best for you. Medications may have side effects. You should review side effects with your caregiver. If your blood pressure stays high after you have made lifestyle changes or started on medicines,   Your medication(s) may need to be changed.  Other problems may need to be addressed.  Be certain you understand your prescriptions, and know how and when to take your medicine.  Be sure to follow up with your caregiver within the time frame advised (usually within two weeks) to have your blood pressure rechecked and to review your medications.  If you are taking more than one medicine to lower your blood pressure, make sure you know how and at what times they should be taken. Taking two medicines at the same time can result in blood pressure that is too low. SEEK IMMEDIATE MEDICAL CARE IF:  You develop a severe headache, blurred  or changing vision, or confusion.  You have unusual weakness or numbness, or a faint feeling.  You have severe chest or abdominal pain, vomiting, or breathing problems. MAKE SURE YOU:   Understand these instructions.  Will watch your condition.  Will get help right away if you are not doing well or get worse. Document Released: 07/10/2005 Document Revised: 10/02/2011 Document Reviewed: 02/28/2008 [facility] Patient Information 2014 [place], [place].  Cholesterol Cholesterol is a white, waxy, fat-like protein needed by your body in small amounts. The liver makes all the cholesterol you need. It is carried from the liver by the blood through the blood vessels. Deposits (plaque) may build up on blood vessel walls. This makes the arteries narrower and stiffer. Plaque increases the risk for heart attack and stroke. You cannot feel your cholesterol level even if it is very high. The only way to know is by a  blood test to check your lipid (fats) levels. Once you know your cholesterol levels, you should keep a record of the test results. Work with your caregiver to to keep your levels in the desired range. WHAT THE RESULTS MEAN:  Total cholesterol is a rough measure of all the cholesterol in your blood.  LDL is the so-called bad cholesterol. This is the type that deposits cholesterol in the walls of the arteries. You want this level to be low.  HDL is the good cholesterol because it cleans the arteries and carries the LDL away. You want this level to be high.  Triglycerides are fat that the body can either burn for energy or store. High levels are closely linked to heart disease. DESIRED LEVELS:  Total cholesterol below 200.  LDL below 100 for people at risk, below 70 for very high risk.  HDL above 50 is good, above 60 is best.  Triglycerides below 150. HOW TO LOWER YOUR CHOLESTEROL:  Diet.  Choose fish or white meat chicken and [place], roasted or baked. Limit fatty cuts of red meat, fried foods, and processed meats, such as sausage and lunch meat.  Eat lots of fresh fruits and vegetables. Choose whole grains, beans, pasta, potatoes and cereals.  Use only small amounts of olive, corn or canola oils. Avoid butter, mayonnaise, shortening or palm kernel oils. Avoid foods with trans-fats.  Use skim/nonfat milk and low-fat/nonfat yogurt and cheeses. Avoid whole milk, cream, ice cream, egg yolks and cheeses. Healthy desserts include angel food cake, ginger snaps, animal crackers, hard candy, popsicles, and low-fat/nonfat frozen yogurt. Avoid pastries, cakes, pies and cookies.  Exercise.  A regular program helps decrease LDL and raises HDL.  Helps with weight control.  Do things that increase your activity level like gardening, walking, or taking the stairs.  Medication.  May be prescribed by your caregiver to help lowering cholesterol and the risk for heart disease.  You may need  medicine even if your levels are normal if you have several risk factors. HOME CARE INSTRUCTIONS   Follow your diet and exercise programs as suggested by your caregiver.  Take medications as directed.  Have blood work done when your caregiver feels it is necessary. MAKE SURE YOU:   Understand these instructions.  Will watch your condition.  Will get help right away if you are not doing well or get worse. Document Released: 04/04/2001 Document Revised: 10/02/2011 Document Reviewed: 09/25/2007 [facility] Patient Information 2014 [place], [place].  Vitamin D Deficiency Vitamin D is an important vitamin that your body needs. Having too little of it in your body is  called a deficiency. A very bad deficiency can make your bones soft and can cause a condition called rickets.  Vitamin D is important to your body for different reasons, such as:   It helps your body absorb 2 minerals called calcium and phosphorus.  It helps make your bones healthy.  It may prevent some diseases, such as diabetes and multiple sclerosis.  It helps your muscles and heart. You can get vitamin D in several ways. It is a natural part of some foods. The vitamin is also added to some dairy products and cereals. Some people take vitamin D supplements. Also, your body makes vitamin D when you are in the sun. It changes the sun's rays into a form of the vitamin that your body can use. CAUSES   Not eating enough foods that contain vitamin D.  Not getting enough sunlight.  Having certain digestive system diseases that make it hard to absorb vitamin D. These diseases include Crohn's disease, chronic pancreatitis, and cystic fibrosis.  Having a surgery in which part of the stomach or small intestine is removed.  Being obese. Fat cells pull vitamin D out of your blood. That means that obese people may not have enough vitamin D left in their blood and in other body tissues.  Having chronic kidney or liver disease. RISK  FACTORS Risk factors are things that make you more likely to develop a vitamin D deficiency. They include:  Being older.  Not being able to get outside very much.  Living in a nursing home.  Having had broken bones.  Having weak or thin bones (osteoporosis).  Having a disease or condition that changes how your body absorbs vitamin D.  Having dark skin.  Some medicines such as seizure medicines or steroids.  Being overweight or obese. SYMPTOMS Mild cases of vitamin D deficiency may not have any symptoms. If you have a very bad case, symptoms may include:  Bone pain.  Muscle pain.  Falling often.  Broken bones caused by a minor injury, due to osteoporosis. DIAGNOSIS A blood test is the best way to tell if you have a vitamin D deficiency. TREATMENT Vitamin D deficiency can be treated in different ways. Treatment for vitamin D deficiency depends on what is causing it. Options include:  Taking vitamin D supplements.  Taking a calcium supplement. Your caregiver will suggest what dose is best for you. HOME CARE INSTRUCTIONS  Take any supplements that your caregiver prescribes. Follow the directions carefully. Take only the suggested amount.  Have your blood tested 2 months after you start taking supplements.  Eat foods that contain vitamin D. Healthy choices include:  Fortified dairy products, cereals, or juices. Fortified means vitamin D has been added to the food. Check the label on the package to be sure.  Fatty fish like salmon or trout.  Eggs.  Oysters.  Do not use a tanning bed.  Keep your weight at a healthy level. Lose weight if you need to.  Keep all follow-up appointments. Your caregiver will need to perform blood tests to make sure your vitamin D deficiency is going away. SEEK MEDICAL CARE IF:  You have any questions about your treatment.  You continue to have symptoms of vitamin D deficiency.  You have nausea or vomiting.  You are  constipated.  You feel confused.  You have severe abdominal or back pain. MAKE SURE YOU:  Understand these instructions.  Will watch your condition.  Will get help right away if you are not  doing well or get worse. Document Released: 10/02/2011 Document Revised: 11/04/2012 Document Reviewed: 10/02/2011 [facility] Patient Information 2014 [place], [place].

## 2013-07-10 LAB — URINALYSIS, MICROSCOPIC ONLY
Bacteria, UA: NONE SEEN
Casts: NONE SEEN
Squamous Epithelial / LPF: NONE SEEN

## 2013-07-10 LAB — TESTOSTERONE: Testosterone: 545 ng/dL (ref 300–890)

## 2013-07-10 LAB — TSH: TSH: 1.606 u[IU]/mL (ref 0.350–4.500)

## 2013-07-11 ENCOUNTER — Telehealth: Payer: Self-pay | Admitting: *Deleted

## 2013-07-11 LAB — VITAMIN D 25 HYDROXY (VIT D DEFICIENCY, FRACTURES): Vit D, 25-Hydroxy: 50 ng/mL (ref 30–89)

## 2013-07-14 ENCOUNTER — Telehealth: Payer: Self-pay | Admitting: *Deleted

## 2013-07-14 NOTE — Telephone Encounter (Signed)
Message copied by Reggy Eye on Mon Jul 14, 2013  4:44 PM ------      Message from: [name]      Created: Fri Jul 11, 2013  1:04 AM       U/a nl- testosterone very good level - CBC Kidneys Liver aall ok - but Mag is low - suggest mag tabs 500 mg 2 x day with meals      Chol 146 great - very low risk for heart attack - Thyroid is normal A1c nl - no diabetes ------

## 2013-07-15 NOTE — Telephone Encounter (Signed)
Patient aware.

## 2013-09-27 ENCOUNTER — Other Ambulatory Visit: Payer: Self-pay | Admitting: Physician Assistant

## 2013-10-14 ENCOUNTER — Ambulatory Visit: Payer: Self-pay | Admitting: Physician Assistant

## 2013-11-17 ENCOUNTER — Ambulatory Visit: Payer: Self-pay | Admitting: Physician Assistant

## 2013-11-27 ENCOUNTER — Ambulatory Visit (INDEPENDENT_AMBULATORY_CARE_PROVIDER_SITE_OTHER): Payer: Medicare Other | Admitting: Physician Assistant

## 2013-11-27 ENCOUNTER — Encounter: Payer: Self-pay | Admitting: Physician Assistant

## 2013-11-27 VITALS — BP 110/68 | HR 60 | Temp 98.2°F | Resp 16 | Ht 67.0 in | Wt 154.0 lb

## 2013-11-27 DIAGNOSIS — R7309 Other abnormal glucose: Secondary | ICD-10-CM

## 2013-11-27 DIAGNOSIS — F329 Major depressive disorder, single episode, unspecified: Secondary | ICD-10-CM

## 2013-11-27 DIAGNOSIS — F32A Depression, unspecified: Secondary | ICD-10-CM

## 2013-11-27 DIAGNOSIS — E559 Vitamin D deficiency, unspecified: Secondary | ICD-10-CM

## 2013-11-27 DIAGNOSIS — E782 Mixed hyperlipidemia: Secondary | ICD-10-CM

## 2013-11-27 DIAGNOSIS — F3289 Other specified depressive episodes: Secondary | ICD-10-CM

## 2013-11-27 DIAGNOSIS — Z79899 Other long term (current) drug therapy: Secondary | ICD-10-CM

## 2013-11-27 DIAGNOSIS — I10 Essential (primary) hypertension: Secondary | ICD-10-CM

## 2013-11-27 LAB — BASIC METABOLIC PANEL WITH GFR
BUN: 11 mg/dL (ref 6–23)
CO2: 27 mEq/L (ref 19–32)
CREATININE: 1.05 mg/dL (ref 0.50–1.35)
Calcium: 9.4 mg/dL (ref 8.4–10.5)
Chloride: 101 mEq/L (ref 96–112)
GFR, Est Non African American: >89 mL/min
GLUCOSE: 101 mg/dL — AB (ref 70–99)
Potassium: 4.4 mEq/L (ref 3.5–5.3)
SODIUM: 139 meq/L (ref 135–145)

## 2013-11-27 LAB — HEPATIC FUNCTION PANEL
ALBUMIN: 4.6 g/dL (ref 3.5–5.2)
ALT: 8 U/L (ref 0–53)
AST: 15 U/L (ref 0–37)
Alkaline Phosphatase: 45 U/L (ref 39–117)
BILIRUBIN INDIRECT: 0.4 mg/dL (ref 0.2–1.2)
Bilirubin, Direct: 0.1 mg/dL (ref 0.0–0.3)
TOTAL PROTEIN: 6.9 g/dL (ref 6.0–8.3)
Total Bilirubin: 0.5 mg/dL (ref 0.2–1.2)

## 2013-11-27 LAB — CBC WITH DIFFERENTIAL/PLATELET
BASOS ABS: 0 10*3/uL (ref 0.0–0.1)
Basophils Relative: 1 % (ref 0–1)
EOS ABS: 0.2 10*3/uL (ref 0.0–0.7)
Eosinophils Relative: 4 % (ref 0–5)
HCT: 45.9 % (ref 39.0–52.0)
Hemoglobin: 16 g/dL (ref 13.0–17.0)
LYMPHS ABS: 1.7 10*3/uL (ref 0.7–4.0)
Lymphocytes Relative: 35 % (ref 12–46)
MCH: 28.5 pg (ref 26.0–34.0)
MCHC: 34.9 g/dL (ref 30.0–36.0)
MCV: 81.8 fL (ref 78.0–100.0)
Monocytes Absolute: 0.4 10*3/uL (ref 0.1–1.0)
Monocytes Relative: 8 % (ref 3–12)
NEUTROS PCT: 52 % (ref 43–77)
Neutro Abs: 2.5 10*3/uL (ref 1.7–7.7)
Platelets: 185 10*3/uL (ref 150–400)
RBC: 5.61 MIL/uL (ref 4.22–5.81)
RDW: 13.8 % (ref 11.5–15.5)
WBC: 4.8 10*3/uL (ref 4.0–10.5)

## 2013-11-27 LAB — LIPID PANEL
Cholesterol: 138 mg/dL (ref 0–200)
HDL: 44 mg/dL (ref >39)
LDL Cholesterol: 81 mg/dL (ref 0–99)
Total CHOL/HDL Ratio: 3.1 Ratio
Triglycerides: 66 mg/dL (ref <150)
VLDL: 13 mg/dL (ref 0–40)

## 2013-11-27 LAB — TSH: TSH: 3.179 u[IU]/mL (ref 0.350–4.500)

## 2013-11-27 LAB — MAGNESIUM: Magnesium: 1.7 mg/dL (ref 1.5–2.5)
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
## 2013-11-27 NOTE — Patient Instructions (Signed)
Nicotine skin patches What is this medicine? NICOTINE (NIK [place] teen) helps people stop smoking. The patches replace the nicotine found in cigarettes and help to decrease withdrawal effects. They are most effective when used in combination with a stop-smoking program. This medicine may be used for other purposes; ask your health care provider or pharmacist if you have questions. COMMON BRAND NAME(S): Habitrol, Nicoderm CQ, Nicotrol What should I tell my health care provider before I take this medicine? They need to know if you have any of these conditions: -diabetes -heart disease, angina, irregular heartbeat or previous heart attack -lung disease, including asthma -overactive thyroid -pheochromocytoma -skin problems -stomach problems or ulcers -an unusual or allergic reaction to nicotine, adhesives, other medicines, foods, dyes, or preservatives -pregnant or trying to get pregnant -breast-feeding How should I use this medicine? This medicine is for use on the skin. Follow the directions that come with the patches. Find an area of skin on your upper arm, chest, or back that is clean, dry, greaseless, undamaged and hairless. Wash hands with plain soap and water. Do not use anything that contains aloe, lanolin or glycerin as these may prevent the patch from sticking. Dry thoroughly. Remove the patch from the sealed pouch. Do not try to cut or trim the patch. Using your palm, press the patch firmly in place for 10 seconds to make sure that there is good contact with your skin. After applying the patch, wash your hands. Change the patch every day, keeping to a regular schedule. When you apply a new patch, use a new area of skin. Wait at least 1 week before using the same area again. Talk to your pediatrician regarding the use of this medicine in children. Special care may be needed. Overdosage: If you think you have taken too much of this medicine contact a poison control center or emergency room at  once. NOTE: This medicine is only for you. Do not share this medicine with others. What if I miss a dose? If you forget to replace a patch, use it as soon as you can. Only use one patch at a time and do not leave on the skin for longer than directed. If a patch falls off, you can replace it, but keep to your schedule and remove the patch at the right time. What may interact with this medicine? -medicines for asthma -medicines for blood pressure -medicines for mental depression This list may not describe all possible interactions. Give your health care provider a list of all the medicines, herbs, non-prescription drugs, or dietary supplements you use. Also tell them if you smoke, drink alcohol, or use illegal drugs. Some items may interact with your medicine. What should I watch for while using this medicine? Do not smoke, chew nicotine gum, or use snuff while you are using this medicine. This reduces the chance of a nicotine overdose. You can keep the patch in place during swimming, bathing, and showering. If your patch falls off during these activities, replace it. When you first apply the patch, your skin may itch or burn. This should soon go away. When you remove a patch, the skin may look red, but this should only last for a day. Call your doctor or health care professional if you get a permanent skin rash. If you are a diabetic and you quit smoking, the effects of insulin may be increased and you may need to reduce your insulin dose. Check with your doctor or health care professional about how you should  adjust your insulin dose. If you are going to have a magnetic resonance imaging (MRI) procedure, tell your MRI technician if you have this patch on your body. It must be removed before a MRI. What side effects may I notice from receiving this medicine? Side effects that you should report to your doctor or health care professional as soon as possible: -allergic reactions like skin rash, itching  or hives, swelling of the face, lips, or tongue -breathing problems -changes in hearing -changes in vision -chest pain -cold sweats -confusion -fast, irregular heartbeat -feeling faint or lightheaded, falls -headache -increased saliva -nausea, vomiting -skin redness that lasts more than 4 days -stomach pain -weakness Side effects that usually do not require medical attention (report to your doctor or health care professional if they continue or are bothersome): -diarrhea -dry mouth -hiccups -irritability -nervousness or restlessness -trouble sleeping or vivid dreams This list may not describe all possible side effects. Call your doctor for medical advice about side effects. You may report side effects to FDA at 1-800-FDA-1088. Where should I keep my medicine? Keep out of the reach of children. Store at room temperature between 20 and 25 degrees C (68 and 77 degrees F). Protect from heat and light. Store in [employer] packaging until ready to use. Throw away unused medicine after the expiration date. When you remove a patch, fold with sticky sides together; put in an empty opened pouch and throw away. NOTE: This sheet is a summary. It may not cover all possible information. If you have questions about this medicine, talk to your doctor, pharmacist, or health care provider.  2014, Elsevier/Gold Standard. (2010-09-13 13:06:00) Smoking Cessation Quitting smoking is important to your health and has many advantages. However, it is not always easy to quit since nicotine is a very addictive drug. Often times, people try 3 times or more before being able to quit. This document explains the best ways for you to prepare to quit smoking. Quitting takes hard work and a lot of effort, but you can do it. ADVANTAGES OF QUITTING SMOKING  You will live longer, feel better, and live better.  Your body will feel the impact of quitting smoking almost immediately.  Within 20 minutes, blood pressure  decreases. Your pulse returns to its normal level.  After 8 hours, carbon monoxide levels in the blood return to normal. Your oxygen level increases.  After 24 hours, the chance of having a heart attack starts to decrease. Your breath, hair, and body stop smelling like smoke.  After 48 hours, damaged nerve endings begin to recover. Your sense of taste and smell improve.  After 72 hours, the body is virtually free of nicotine. Your bronchial tubes relax and breathing becomes easier.  After 2 to 12 weeks, lungs can hold more air. Exercise becomes easier and circulation improves.  The risk of having a heart attack, stroke, cancer, or lung disease is greatly reduced.  After 1 year, the risk of coronary heart disease is cut in half.  After 5 years, the risk of stroke falls to the same as a nonsmoker.  After 10 years, the risk of lung cancer is cut in half and the risk of other cancers decreases significantly.  After 15 years, the risk of coronary heart disease drops, usually to the level of a nonsmoker.  If you are pregnant, quitting smoking will improve your chances of having a healthy baby.  The people you live with, especially any children, will be healthier.  You will have extra  money to spend on things other than cigarettes. QUESTIONS TO THINK ABOUT BEFORE ATTEMPTING TO QUIT You may want to talk about your answers with your caregiver.  Why do you want to quit?  If you tried to quit in the past, what helped and what did not?  What will be the most difficult situations for you after you quit? How will you plan to handle them?  Who can help you through the tough times? Your family? Friends? A caregiver?  What pleasures do you get from smoking? What ways can you still get pleasure if you quit? Here are some questions to ask your caregiver:  How can you help me to be successful at quitting?  What medicine do you think would be best for me and how should I take it?  What should  I do if I need more help?  What is smoking withdrawal like? How can I get information on withdrawal? GET READY  Set a quit date.  Change your environment by getting rid of all cigarettes, ashtrays, matches, and lighters in your home, car, or work. Do not let people smoke in your home.  Review your past attempts to quit. Think about what worked and what did not. GET SUPPORT AND ENCOURAGEMENT You have a better chance of being successful if you have help. You can get support in many ways.  Tell your family, friends, and co-workers that you are going to quit and need their support. Ask them not to smoke around you.  Get individual, group, or telephone counseling and support. Programs are available at [employer] and health centers. Call your local health department for information about programs in your area.  Spiritual beliefs and practices may help some smokers quit.  Download a "quit meter" on your computer to keep track of quit statistics, such as how long you have gone without smoking, cigarettes not smoked, and money saved.  Get a self-help book about quitting smoking and staying off of tobacco. LEARN NEW SKILLS AND BEHAVIORS  Distract yourself from urges to smoke. Talk to someone, go for a walk, or occupy your time with a task.  Change your normal routine. Take a different route to work. Drink tea instead of coffee. Eat breakfast in a different place.  Reduce your stress. Take a hot bath, exercise, or read a book.  Plan something enjoyable to do every day. Reward yourself for not smoking.  Explore interactive web-based programs that specialize in helping you quit. GET MEDICINE AND USE IT CORRECTLY Medicines can help you stop smoking and decrease the urge to smoke. Combining medicine with the above behavioral methods and support can greatly increase your chances of successfully quitting smoking.  Nicotine replacement therapy helps deliver nicotine to your body without the  negative effects and risks of smoking. Nicotine replacement therapy includes nicotine gum, lozenges, inhalers, nasal sprays, and skin patches. Some may be available over-the-counter and others require a prescription.  Antidepressant medicine helps people abstain from smoking, but how this works is unknown. This medicine is available by prescription.  Nicotinic receptor partial agonist medicine simulates the effect of nicotine in your brain. This medicine is available by prescription. Ask your caregiver for advice about which medicines to use and how to use them based on your health history. Your caregiver will tell you what side effects to look out for if you choose to be on a medicine or therapy. Carefully read the information on the package. Do not use any other product containing nicotine  while using a nicotine replacement product.  RELAPSE OR DIFFICULT SITUATIONS Most relapses occur within the first 3 months after quitting. Do not be discouraged if you start smoking again. Remember, most people try several times before finally quitting. You may have symptoms of withdrawal because your body is used to nicotine. You may crave cigarettes, be irritable, feel very hungry, cough often, get headaches, or have difficulty concentrating. The withdrawal symptoms are only temporary. They are strongest when you first quit, but they will go away within 10 14 days. To reduce the chances of relapse, try to:  Avoid drinking alcohol. Drinking lowers your chances of successfully quitting.  Reduce the amount of caffeine you consume. Once you quit smoking, the amount of caffeine in your body increases and can give you symptoms, such as a rapid heartbeat, sweating, and anxiety.  Avoid smokers because they can make you want to smoke.  Do not let weight gain distract you. Many smokers will gain weight when they quit, usually less than 10 pounds. Eat a healthy diet and stay active. You can always lose the weight gained  after you quit.  Find ways to improve your mood other than smoking. FOR MORE INFORMATION  www.smokefree.gov  Document Released: 07/04/2001 Document Revised: 01/09/2012 Document Reviewed: 10/19/2011 [facility] Patient Information 2014 [place], [place].

## 2013-11-28 ENCOUNTER — Other Ambulatory Visit: Payer: Self-pay

## 2014-01-13 ENCOUNTER — Ambulatory Visit: Payer: Self-pay | Admitting: Internal Medicine

## 2014-01-14 ENCOUNTER — Ambulatory Visit (INDEPENDENT_AMBULATORY_CARE_PROVIDER_SITE_OTHER): Payer: Medicare Other | Admitting: Internal Medicine

## 2014-01-14 ENCOUNTER — Encounter: Payer: Self-pay | Admitting: Internal Medicine

## 2014-01-14 VITALS — BP 116/80 | HR 68 | Temp 98.1°F | Resp 16 | Ht 67.0 in | Wt 154.6 lb

## 2014-01-14 DIAGNOSIS — R5383 Other fatigue: Secondary | ICD-10-CM

## 2014-01-14 DIAGNOSIS — R7309 Other abnormal glucose: Secondary | ICD-10-CM

## 2014-01-14 DIAGNOSIS — R030 Elevated blood-pressure reading, without diagnosis of hypertension: Secondary | ICD-10-CM

## 2014-01-14 DIAGNOSIS — E559 Vitamin D deficiency, unspecified: Secondary | ICD-10-CM

## 2014-01-14 DIAGNOSIS — Z79899 Other long term (current) drug therapy: Secondary | ICD-10-CM

## 2014-01-14 DIAGNOSIS — R5381 Other malaise: Secondary | ICD-10-CM

## 2014-01-14 LAB — CBC WITH DIFFERENTIAL/PLATELET
Basophils Absolute: 0 10*3/uL (ref 0.0–0.1)
Basophils Relative: 1 % (ref 0–1)
Eosinophils Absolute: 0.2 10*3/uL (ref 0.0–0.7)
Eosinophils Relative: 4 % (ref 0–5)
HCT: 49.7 % (ref 39.0–52.0)
HEMOGLOBIN: 17.1 g/dL — AB (ref 13.0–17.0)
LYMPHS ABS: 1.4 10*3/uL (ref 0.7–4.0)
LYMPHS PCT: 30 % (ref 12–46)
MCH: 28.4 pg (ref 26.0–34.0)
MCHC: 34.4 g/dL (ref 30.0–36.0)
MCV: 82.4 fL (ref 78.0–100.0)
Monocytes Absolute: 0.4 10*3/uL (ref 0.1–1.0)
Monocytes Relative: 8 % (ref 3–12)
NEUTROS ABS: 2.6 10*3/uL (ref 1.7–7.7)
NEUTROS PCT: 57 % (ref 43–77)
Platelets: 189 10*3/uL (ref 150–400)
RBC: 6.03 MIL/uL — AB (ref 4.22–5.81)
RDW: 13.7 % (ref 11.5–15.5)
WBC: 4.6 10*3/uL (ref 4.0–10.5)

## 2014-01-14 LAB — HEMOGLOBIN A1C
HEMOGLOBIN A1C: 5.4 % (ref <5.7)
Mean Plasma Glucose: 108 mg/dL (ref <117)
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
## 2014-01-14 NOTE — Patient Instructions (Signed)
Restart Vit D 5,000 units daily  Vitamin D Deficiency Vitamin D is an important vitamin that your body needs. Having too little of it in your body is called a deficiency. A very bad deficiency can make your bones soft and can cause a condition called rickets.  Vitamin D is important to your body for different reasons, such as:   It helps your body absorb 2 minerals called calcium and phosphorus.  It helps make your bones healthy.  It may prevent some diseases, such as diabetes and multiple sclerosis.  It helps your muscles and heart. You can get vitamin D in several ways. It is a natural part of some foods. The vitamin is also added to some dairy products and cereals. Some people take vitamin D supplements. Also, your body makes vitamin D when you are in the sun. It changes the sun's rays into a form of the vitamin that your body can use. CAUSES   Not eating enough foods that contain vitamin D.  Not getting enough sunlight.  Having certain digestive system diseases that make it hard to absorb vitamin D. These diseases include Crohn's disease, chronic pancreatitis, and cystic fibrosis.  Having a surgery in which part of the stomach or small intestine is removed.  Being obese. Fat cells pull vitamin D out of your blood. That means that obese people may not have enough vitamin D left in their blood and in other body tissues.  Having chronic kidney or liver disease. RISK FACTORS Risk factors are things that make you more likely to develop a vitamin D deficiency. They include:  Being older.  Not being able to get outside very much.  Living in a nursing home.  Having had broken bones.  Having weak or thin bones (osteoporosis).  Having a disease or condition that changes how your body absorbs vitamin D.  Having dark skin.  Some medicines such as seizure medicines or steroids.  Being overweight or obese. SYMPTOMS Mild cases of vitamin D deficiency may not have any symptoms.  If you have a very bad case, symptoms may include:  Bone pain.  Muscle pain.  Falling often.  Broken bones caused by a minor injury, due to osteoporosis. DIAGNOSIS A blood test is the best way to tell if you have a vitamin D deficiency. TREATMENT Vitamin D deficiency can be treated in different ways. Treatment for vitamin D deficiency depends on what is causing it. Options include:  Taking vitamin D supplements.  Taking a calcium supplement. Your caregiver will suggest what dose is best for you. HOME CARE INSTRUCTIONS  Take any supplements that your caregiver prescribes. Follow the directions carefully. Take only the suggested amount.  Have your blood tested 2 months after you start taking supplements.  Eat foods that contain vitamin D. Healthy choices include:  Fortified dairy products, cereals, or juices. Fortified means vitamin D has been added to the food. Check the label on the package to be sure.  Fatty fish like salmon or trout.  Eggs.  Oysters.  Do not use a tanning bed.  Keep your weight at a healthy level. Lose weight if you need to.  Keep all follow-up appointments. Your caregiver will need to perform blood tests to make sure your vitamin D deficiency is going away. SEEK MEDICAL CARE IF:  You have any questions about your treatment.  You continue to have symptoms of vitamin D deficiency.  You have nausea or vomiting.  You are constipated.  You feel confused.  You  have severe abdominal or back pain. MAKE SURE YOU:  Understand these instructions.  Will watch your condition.  Will get help right away if you are not doing well or get worse. Document Released: 10/02/2011 Document Revised: 11/04/2012 Document Reviewed: 10/02/2011 [facility] Patient Information 2015 [place], [place]. This information is not intended to replace advice given to you by your health care Karsyn Rochin. Make sure you discuss any questions you have with your health care  Dore Oquin. Traumatic Brain Injury Traumatic brain injury (TBI) occurs when an injury to the head causes the brain to move back and forth. The risk of brain injury varies with the severity of the trauma. The damage can be confined to one area of the brain (focal) or involve different areas of the brain (diffuse). The severity of a brain injury can range from:  A blow or jolt to the head that disrupts the normal function of the brain (concussion).  A deep state of unconsciousness (coma).  Death. CAUSES  A brain injury can result from:  A closed head injury. This occurs when the head suddenly and violently hits an object but the object does not break through the skull. Examples include:  A direct blow (hitting your head on a hard surface).  An indirect blow (when your head moves rapidly and violently back and forth, like in a car crash). This injury is called contrecoup (involving a blow and counter blow). Shaken baby syndrome is a severe type of this injury. It happens when a baby is shaken forcibly enough to cause extreme contrecoup injury.  Penetrating head injury. A penetrating head injury occurs when an object pierces the skull and enters the brain tissue. Examples include:  A skull fracture occurs when the skull cracks or breaks.  A depressed skull fracture occurs when pieces of the broken skull press into the tissue of the brain. This can cause bruising of the brain tissue called a contusion. In both closed and penetrating head injuries, damage to blood vessels can cause heavy bleeding into or around the brain.  SYMPTOMS  The symptoms of a TBI depend on the type and severity of the injury. The most common symptoms include:   Confusion (disorientation) or other thinking problems.  An inability to remember events around the time of the injury (amnesia).  Difficulty staying awake or passing out (loss of consciousness).  Difficulty maintaining your balance or feeling unsteady.  Slow  reaction time.  Difficulty learning or remembering things you have heard.  Headache.  Blurry vision.  Vomiting.  Seizures.  Swelling of the scalp. This occurs because of bleeding or swelling under the skin of the skull when the head is hit. TREATMENT Treatment of traumatic brain injury can involve a range of different medical options:  If a brain injury is moderate to severe, a hospital stay will be necessary to monitor:  Neurological status.  Pressure or swelling of the brain (intracranial pressure).  For seizures.  Severe brain injury cases may need surgery to:  Control bleeding.  Relieve pressure on the brain.  Remove objects from the brain that result from a penetrating injury.  Repair the skull from an injury.  Long-term treatment of a brain injury can involve rehabilitation work such as:  Physical therapy.  Occupational therapy.  Speech therapy. PROGNOSIS The outcome of TBI depends on the cause of the injury, location, severity, and extent of neurological damage. Outcomes range from good recovery to death. Long term consequences of a TBI can include:  Difficulty concentrating or  having a short attention span.  Change in personality.  Irritability.  Headaches.  Blurry vision.  Sleepiness.  Depression.  Unsteadiness that makes walking or standing hard to do. For more information and support, contact: [employer].  Document Released: 06/30/2002 Document Revised: 04/30/2013 Document Reviewed: 07/08/2009 [facility] Patient Information 2015 [place]. This information is not intended to replace advice given to you by your health care Yoltzin Ransom. Make sure you discuss any questions you have with your health care Jartavious Mckimmy.

## 2014-01-15 LAB — HEPATIC FUNCTION PANEL
ALK PHOS: 45 U/L (ref 39–117)
ALT: 15 U/L (ref 0–53)
AST: 19 U/L (ref 0–37)
Albumin: 5 g/dL (ref 3.5–5.2)
Bilirubin, Direct: 0.1 mg/dL (ref 0.0–0.3)
Indirect Bilirubin: 0.6 mg/dL (ref 0.2–1.2)
Total Bilirubin: 0.7 mg/dL (ref 0.2–1.2)
Total Protein: 7.4 g/dL (ref 6.0–8.3)

## 2014-01-15 LAB — BASIC METABOLIC PANEL WITH GFR
BUN: 16 mg/dL (ref 6–23)
CALCIUM: 10 mg/dL (ref 8.4–10.5)
CHLORIDE: 101 meq/L (ref 96–112)
CO2: 25 meq/L (ref 19–32)
Creat: 1.03 mg/dL (ref 0.50–1.35)
GFR, Est African American: >89 mL/min
GFR, Est Non African American: >89 mL/min
Glucose, Bld: 92 mg/dL (ref 70–99)
POTASSIUM: 4.6 meq/L (ref 3.5–5.3)
Sodium: 139 mEq/L (ref 135–145)

## 2014-01-15 LAB — INSULIN, FASTING: Insulin fasting, serum: 7 u[IU]/mL (ref 3–28)

## 2014-01-15 LAB — MAGNESIUM: Magnesium: 1.8 mg/dL (ref 1.5–2.5)

## 2014-01-15 LAB — TSH: TSH: 1.373 u[IU]/mL (ref 0.350–4.500)

## 2014-01-15 LAB — TESTOSTERONE: Testosterone: 404 ng/dL (ref 300–890)

## 2014-01-15 LAB — VITAMIN D 25 HYDROXY (VIT D DEFICIENCY, FRACTURES): VIT D 25 HYDROXY: 49 ng/mL (ref 30–89)

## 2014-03-18 ENCOUNTER — Other Ambulatory Visit: Payer: Self-pay | Admitting: Physician Assistant

## 2014-07-20 ENCOUNTER — Ambulatory Visit: Payer: Medicare Other | Admitting: Internal Medicine

## 2014-07-20 DIAGNOSIS — Z0000 Encounter for general adult medical examination without abnormal findings: Secondary | ICD-10-CM

## 2014-07-20 DIAGNOSIS — E559 Vitamin D deficiency, unspecified: Secondary | ICD-10-CM | POA: Insufficient documentation

## 2014-07-20 DIAGNOSIS — Z79899 Other long term (current) drug therapy: Secondary | ICD-10-CM | POA: Insufficient documentation

## 2014-07-20 NOTE — Progress Notes (Signed)
Patient ID: Troy LovelessSeth R Brunelle, male   DOB: 1980/09/17, 33 y.o.   MRN: [id number]  Stephenie Acres [facility]      [facility]

## 2014-07-21 ENCOUNTER — Encounter: Payer: Self-pay | Admitting: Internal Medicine

## 2014-07-28 ENCOUNTER — Emergency Department (HOSPITAL_COMMUNITY)
Admission: EM | Admit: 2014-07-28 | Discharge: 2014-07-28 | Disposition: A | Discharge status: Home / Self Care | Payer: Medicare Other | Attending: Emergency Medicine | Admitting: Emergency Medicine

## 2014-07-28 ENCOUNTER — Encounter (HOSPITAL_COMMUNITY): Payer: Self-pay

## 2014-07-28 DIAGNOSIS — Z87828 Personal history of other (healed) physical injury and trauma: Secondary | ICD-10-CM | POA: Insufficient documentation

## 2014-07-28 DIAGNOSIS — Y998 Other external cause status: Secondary | ICD-10-CM | POA: Diagnosis not present

## 2014-07-28 DIAGNOSIS — Z79899 Other long term (current) drug therapy: Secondary | ICD-10-CM | POA: Insufficient documentation

## 2014-07-28 DIAGNOSIS — Y9389 Activity, other specified: Secondary | ICD-10-CM | POA: Diagnosis not present

## 2014-07-28 DIAGNOSIS — T50904A Poisoning by unspecified drugs, medicaments and biological substances, undetermined, initial encounter: Secondary | ICD-10-CM

## 2014-07-28 DIAGNOSIS — Y929 Unspecified place or not applicable: Secondary | ICD-10-CM | POA: Insufficient documentation

## 2014-07-28 DIAGNOSIS — Z87820 Personal history of traumatic brain injury: Secondary | ICD-10-CM | POA: Insufficient documentation

## 2014-07-28 DIAGNOSIS — F329 Major depressive disorder, single episode, unspecified: Secondary | ICD-10-CM | POA: Insufficient documentation

## 2014-07-28 DIAGNOSIS — T424X1A Poisoning by benzodiazepines, accidental (unintentional), initial encounter: Secondary | ICD-10-CM | POA: Insufficient documentation

## 2014-07-28 DIAGNOSIS — Z720 Tobacco use: Secondary | ICD-10-CM | POA: Diagnosis not present

## 2014-07-28 DIAGNOSIS — I10 Essential (primary) hypertension: Secondary | ICD-10-CM | POA: Diagnosis not present

## 2014-07-28 DIAGNOSIS — X58XXXA Exposure to other specified factors, initial encounter: Secondary | ICD-10-CM | POA: Insufficient documentation
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
## 2014-07-28 NOTE — Discharge Instructions (Signed)
Please call your doctor for a followup appointment within 24-48 hours. When you talk to your doctor please let them know that you were seen in the emergency department and have them acquire all of your records so that they can discuss the findings with you and formulate a treatment plan to fully care for your new and ongoing problems. ° °

## 2014-07-28 NOTE — ED Notes (Signed)
MD at bedside. 
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
## 2014-09-01 ENCOUNTER — Other Ambulatory Visit: Payer: Self-pay | Admitting: Internal Medicine

## 2014-09-08 ENCOUNTER — Other Ambulatory Visit: Payer: Self-pay | Admitting: Internal Medicine

## 2014-09-08 DIAGNOSIS — G4700 Insomnia, unspecified: Secondary | ICD-10-CM

## 2014-09-08 MED ORDER — TRAZODONE HCL 150 MG PO TABS: ORAL_TABLET | ORAL | Status: DC

## 2014-09-17 ENCOUNTER — Encounter: Payer: Self-pay | Admitting: Internal Medicine

## 2014-09-17 ENCOUNTER — Ambulatory Visit (INDEPENDENT_AMBULATORY_CARE_PROVIDER_SITE_OTHER): Payer: Medicare Other | Admitting: Internal Medicine

## 2014-09-17 VITALS — BP 116/72 | HR 72 | Temp 99.1°F | Resp 18 | Ht 67.5 in | Wt 157.4 lb

## 2014-09-17 DIAGNOSIS — R7303 Prediabetes: Secondary | ICD-10-CM

## 2014-09-17 DIAGNOSIS — E559 Vitamin D deficiency, unspecified: Secondary | ICD-10-CM

## 2014-09-17 DIAGNOSIS — F329 Major depressive disorder, single episode, unspecified: Secondary | ICD-10-CM

## 2014-09-17 DIAGNOSIS — Z1212 Encounter for screening for malignant neoplasm of rectum: Secondary | ICD-10-CM

## 2014-09-17 DIAGNOSIS — Z9181 History of falling: Secondary | ICD-10-CM

## 2014-09-17 DIAGNOSIS — Z1331 Encounter for screening for depression: Secondary | ICD-10-CM

## 2014-09-17 DIAGNOSIS — R7309 Other abnormal glucose: Secondary | ICD-10-CM

## 2014-09-17 DIAGNOSIS — F32A Depression, unspecified: Secondary | ICD-10-CM

## 2014-09-17 DIAGNOSIS — I10 Essential (primary) hypertension: Secondary | ICD-10-CM

## 2014-09-17 DIAGNOSIS — Z79899 Other long term (current) drug therapy: Secondary | ICD-10-CM

## 2014-09-17 DIAGNOSIS — L709 Acne, unspecified: Secondary | ICD-10-CM

## 2014-09-17 DIAGNOSIS — E782 Mixed hyperlipidemia: Secondary | ICD-10-CM

## 2014-09-17 DIAGNOSIS — S069X0S Unspecified intracranial injury without loss of consciousness, sequela: Secondary | ICD-10-CM

## 2014-09-17 DIAGNOSIS — R5383 Other fatigue: Secondary | ICD-10-CM

## 2014-09-17 LAB — CBC WITH DIFFERENTIAL/PLATELET
Basophils Absolute: 0 10*3/uL (ref 0.0–0.1)
Basophils Relative: 1 % (ref 0–1)
EOS PCT: 2 % (ref 0–5)
Eosinophils Absolute: 0.1 10*3/uL (ref 0.0–0.7)
HCT: 44.4 % (ref 39.0–52.0)
Hemoglobin: 15.3 g/dL (ref 13.0–17.0)
LYMPHS PCT: 25 % (ref 12–46)
Lymphs Abs: 1.2 10*3/uL (ref 0.7–4.0)
MCH: 28.7 pg (ref 26.0–34.0)
MCHC: 34.5 g/dL (ref 30.0–36.0)
MCV: 83.1 fL (ref 78.0–100.0)
MONO ABS: 0.3 10*3/uL (ref 0.1–1.0)
MPV: 10.8 fL (ref 8.6–12.4)
Monocytes Relative: 7 % (ref 3–12)
Neutro Abs: 3.1 10*3/uL (ref 1.7–7.7)
Neutrophils Relative %: 65 % (ref 43–77)
PLATELETS: 193 10*3/uL (ref 150–400)
RBC: 5.34 MIL/uL (ref 4.22–5.81)
RDW: 13.7 % (ref 11.5–15.5)
WBC: 4.8 10*3/uL (ref 4.0–10.5)
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
## 2014-09-17 NOTE — Patient Instructions (Signed)
Recommend the book "The END of DIETING" by Dr Excell Seltzer   & the book "The END of DIABETES " by Dr Excell Seltzer  At [email or web address] - get book & Audio CD's      Being diabetic has a  300% increased risk for heart attack, stroke, cancer, and alzheimer- type vascular dementia. It is very important that you work harder with diet by avoiding all foods that are white. Avoid white rice (brown & wild rice is OK), white potatoes (sweetpotatoes in moderation is OK), White bread or wheat bread or anything made out of white flour like bagels, donuts, rolls, buns, biscuits, cakes, pastries, cookies, pizza crust, and pasta (made from white flour & egg whites) - vegetarian pasta or spinach or wheat pasta is OK. Multigrain breads like Arnold's or [employer], or multigrain sandwich thins or flatbreads.  Diet, exercise and weight loss can reverse and cure diabetes in the early stages.  Diet, exercise and weight loss is very important in the control and prevention of complications of diabetes which affects every system in your body, ie. Brain - dementia/stroke, eyes - glaucoma/blindness, heart - heart attack/heart failure, kidneys - dialysis, stomach - gastric paralysis, intestines - malabsorption, nerves - severe painful neuritis, circulation - gangrene & loss of a leg(s), and finally cancer and Alzheimers.    I recommend avoid fried & greasy foods,  sweets/candy, white rice (brown or wild rice or Quinoa is OK), white potatoes (sweet potatoes are OK) - anything made from white flour - bagels, doughnuts, rolls, buns, biscuits,white and wheat breads, pizza crust and traditional pasta made of white flour & egg white(vegetarian pasta or spinach or wheat pasta is OK).  Multi-grain bread is OK - like multi-grain flat bread or sandwich thins. Avoid alcohol in excess. Exercise is also important.    Eat all the vegetables you want - avoid meat, especially red meat and dairy - especially cheese.  Cheese is the most  concentrated form of trans-fats which is the worst thing to clog up our arteries. Veggie cheese is OK which can be found in the fresh produce section at [employer] or [employer] or [employer]  Preventive Care for Adults A healthy lifestyle and preventive care can promote health and wellness. Preventive health guidelines for men include the following key practices:  A routine yearly physical is a good way to check with your health care provider about your health and preventative screening. It is a chance to share any concerns and updates on your health and to receive a thorough exam.  Visit your dentist for a routine exam and preventative care every 6 months. Brush your teeth twice a day and floss once a day. Good oral hygiene prevents tooth decay and gum disease.  The frequency of eye exams is based on your age, health, family medical history, use of contact lenses, and other factors. Follow your health care provider's recommendations for frequency of eye exams.  Eat a healthy diet. Foods such as vegetables, fruits, whole grains, low-fat dairy products, and lean protein foods contain the nutrients you need without too many calories. Decrease your intake of foods high in solid fats, added sugars, and salt. Eat the right amount of calories for you.Get information about a proper diet from your health care provider, if necessary.  Regular physical exercise is one of the most important things you can do for your health. Most adults should get at least 150 minutes of moderate-intensity exercise (any activity that increases your heart rate  and causes you to sweat) each week. In addition, most adults need muscle-strengthening exercises on 2 or more days a week.  Maintain a healthy weight. The body mass index (BMI) is a screening tool to identify possible weight problems. It provides an estimate of body fat based on height and weight. Your health care provider can find your BMI and can help you achieve or  maintain a healthy weight.For adults 20 years and older:  A BMI below 18.5 is considered underweight.  A BMI of 18.5 to 24.9 is normal.  A BMI of 25 to 29.9 is considered overweight.  A BMI of 30 and above is considered obese.  Maintain normal blood lipids and cholesterol levels by exercising and minimizing your intake of saturated fat. Eat a balanced diet with plenty of fruit and vegetables. Blood tests for lipids and cholesterol should begin at age 9 and be repeated every 5 years. If your lipid or cholesterol levels are high, you are over 50, or you are at high risk for heart disease, you may need your cholesterol levels checked more frequently.Ongoing high lipid and cholesterol levels should be treated with medicines if diet and exercise are not working.  If you smoke, find out from your health care provider how to quit. If you do not use tobacco, do not start.  Lung cancer screening is recommended for adults aged 80-80 years who are at high risk for developing lung cancer because of a history of smoking. A yearly low-dose CT scan of the lungs is recommended for people who have at least a 30-pack-year history of smoking and are a current smoker or have quit within the past 15 years. A pack year of smoking is smoking an average of 1 pack of cigarettes a day for 1 year (for example: 1 pack a day for 30 years or 2 packs a day for 15 years). Yearly screening should continue until the smoker has stopped smoking for at least 15 years. Yearly screening should be stopped for people who develop a health problem that would prevent them from having lung cancer treatment.  If you choose to drink alcohol, do not have more than 2 drinks per day. One drink is considered to be 12 ounces (355 mL) of beer, 5 ounces (148 mL) of wine, or 1.5 ounces (44 mL) of liquor.  High blood pressure causes heart disease and increases the risk of stroke. Your blood pressure should be checked. Ongoing high blood pressure  should be treated with medicines, if weight loss and exercise are not effective.  If you are 8-43 years old, ask your health care provider if you should take aspirin to prevent heart disease.  Diabetes screening involves taking a blood sample to check your fasting blood sugar level. Testing should be considered at a younger age or be carried out more frequently if you are overweight and have at least 1 risk factor for diabetes.  Colorectal cancer can be detected and often prevented. Most routine colorectal cancer screening begins at the age of 89 and continues through age 34. However, your health care provider may recommend screening at an earlier age if you have risk factors for colon cancer. On a yearly basis, your health care provider may provide home test kits to check for hidden blood in the stool. Use of a small camera at the end of a tube to directly examine the colon (sigmoidoscopy or colonoscopy) can detect the earliest forms of colorectal cancer. Talk to your health care provider about  this at age 50, when routine screening begins. Direct exam of the colon should be repeated every 5-10 years through age 75, unless early forms of precancerous polyps or small growths are found.  Screening for abdominal aortic aneurysm (AAA)  are recommended for persons over age 50 who have history of hypertensionor who are current or former smokers.  Talk with your health care provider about prostate cancer screening.  Testicular cancer screening is recommended for adult males. Screening includes self-exam, a health care provider exam, and other screening tests. Consult with your health care provider about any symptoms you have or any concerns you have about testicular cancer.  Use sunscreen. Apply sunscreen liberally and repeatedly throughout the day. You should seek shade when your shadow is shorter than you. Protect yourself by wearing long sleeves, pants, a wide-brimmed hat, and sunglasses year round,  whenever you are outdoors.  Once a month, do a whole-body skin exam, using a mirror to look at the skin on your back. Tell your health care provider about new moles, moles that have irregular borders, moles that are larger than a pencil eraser, or moles that have changed in shape or color.  Stay current with required vaccines (immunizations).  Influenza vaccine. All adults should be immunized every year.  Tetanus, diphtheria, and acellular pertussis (Td, Tdap) vaccine. An adult who has not previously received Tdap or who does not know his vaccine status should receive 1 dose of Tdap. This initial dose should be followed by tetanus and diphtheria toxoids (Td) booster doses every 10 years. Adults with an unknown or incomplete history of completing a 3-dose immunization series with Td-containing vaccines should begin or complete a primary immunization series including a Tdap dose. Adults should receive a Td booster every 10 years.  Zoster vaccine. One dose is recommended for adults aged 60 years or older unless certain conditions are present.    Pneumococcal 13-valent conjugate (PCV13) vaccine. When indicated, a person who is uncertain of his immunization history and has no record of immunization should receive the PCV13 vaccine. An adult aged 19 years or older who has certain medical conditions and has not been previously immunized should receive 1 dose of PCV13 vaccine. This PCV13 should be followed with a dose of pneumococcal polysaccharide (PPSV23) vaccine. The PPSV23 vaccine dose should be obtained at least 8 weeks after the dose of PCV13 vaccine. An adult aged 19 years or older who has certain medical conditions and previously received 1 or more doses of PPSV23 vaccine should receive 1 dose of PCV13. The PCV13 vaccine dose should be obtained 1 or more years after the last PPSV23 vaccine dose.    Pneumococcal polysaccharide (PPSV23) vaccine. When PCV13 is also indicated, PCV13 should be obtained  first. All adults aged 65 years and older should be immunized. An adult younger than age 65 years who has certain medical conditions should be immunized. Any person who resides in a nursing home or long-term care facility should be immunized. An adult smoker should be immunized. People with an immunocompromised condition and certain other conditions should receive both PCV13 and PPSV23 vaccines. People with human immunodeficiency virus (HIV) infection should be immunized as soon as possible after diagnosis. Immunization during chemotherapy or radiation therapy should be avoided. Routine use of PPSV23 vaccine is not recommended for American Indians, [place] Natives, or people younger than 65 years unless there are medical conditions that require PPSV23 vaccine. When indicated, people who have unknown immunization and have no record of immunization should receive   PPSV23 vaccine. One-time revaccination 5 years after the first dose of PPSV23 is recommended for people aged 19-64 years who have chronic kidney failure, nephrotic syndrome, asplenia, or immunocompromised conditions. People who received 1-2 doses of PPSV23 before age 3 years should receive another dose of PPSV23 vaccine at age 66 years or later if at least 5 years have passed since the previous dose. Doses of PPSV23 are not needed for people immunized with PPSV23 at or after age 4 years.  Hepatitis A vaccine. Adults who wish to be protected from this disease, have certain high-risk conditions, work with hepatitis A-infected animals, work in hepatitis A research labs, or travel to or work in countries with a high rate of hepatitis A should be immunized. Adults who were previously unvaccinated and who anticipate close contact with an international adoptee during the first 60 days after arrival in the [place] from a country with a high rate of hepatitis A should be immunized.  Hepatitis B vaccine. Adults should be immunized if they wish to be  protected from this disease, have certain high-risk conditions, may be exposed to blood or other infectious body fluids, are household contacts or sex partners of hepatitis B positive people, are clients or workers in certain care facilities, or travel to or work in countries with a high rate of hepatitis B.  Preventive Service / Frequency  Ages 72 to 80  Blood pressure check.  Lipid and cholesterol check.  Hepatitis C blood test.** / For any individual with known risks for hepatitis C.  Skin self-exam. / Monthly.  Influenza vaccine. / Every year.  Tetanus, diphtheria, and acellular pertussis (Tdap, Td) vaccine.** / Consult your health care provider. 1 dose of Td every 10 years.  HPV vaccine. / 3 doses over 6 months, if 61 or younger.  Measles, mumps, rubella (MMR) vaccine.** / You need at least 1 dose of MMR if you were born in 1957 or later. You may also need a second dose.  Pneumococcal 13-valent conjugate (PCV13) vaccine.** / Consult your health care provider.  Pneumococcal polysaccharide (PPSV23) vaccine.** / 1 to 2 doses if you smoke cigarettes or if you have certain conditions.  Meningococcal vaccine.** / 1 dose if you are age 58 to 77 years and a Market researcher living in a residence hall, or have one of several medical conditions. You may also need additional booster doses.  Hepatitis A vaccine.** / Consult your health care provider.  Hepatitis B vaccine.** / Consult your health care provider.

## 2014-09-18 LAB — BASIC METABOLIC PANEL WITH GFR
BUN: 10 mg/dL (ref 6–23)
CALCIUM: 9.3 mg/dL (ref 8.4–10.5)
CHLORIDE: 102 meq/L (ref 96–112)
CO2: 29 meq/L (ref 19–32)
CREATININE: 0.89 mg/dL (ref 0.50–1.35)
GFR, Est African American: >89 mL/min
GFR, Est Non African American: >89 mL/min
GLUCOSE: 108 mg/dL — AB (ref 70–99)
Potassium: 4 mEq/L (ref 3.5–5.3)
Sodium: 139 mEq/L (ref 135–145)

## 2014-09-18 LAB — IRON AND TIBC
%SAT: 47 % (ref 20–55)
Iron: 122 ug/dL (ref 42–165)
TIBC: 259 ug/dL (ref 215–435)
UIBC: 137 ug/dL (ref 125–400)

## 2014-09-18 LAB — MICROALBUMIN / CREATININE URINE RATIO
CREATININE, URINE: 207.6 mg/dL
MICROALB/CREAT RATIO: 2.4 mg/g (ref 0.0–30.0)
Microalb, Ur: 0.5 mg/dL (ref <2.0)

## 2014-09-18 LAB — HEPATIC FUNCTION PANEL
ALBUMIN: 4.5 g/dL (ref 3.5–5.2)
ALT: 12 U/L (ref 0–53)
AST: 17 U/L (ref 0–37)
Alkaline Phosphatase: 43 U/L (ref 39–117)
Bilirubin, Direct: 0.1 mg/dL (ref 0.0–0.3)
Indirect Bilirubin: 0.3 mg/dL (ref 0.2–1.2)
TOTAL PROTEIN: 7 g/dL (ref 6.0–8.3)
Total Bilirubin: 0.4 mg/dL (ref 0.2–1.2)

## 2014-09-18 LAB — HEMOGLOBIN A1C
HEMOGLOBIN A1C: 5.7 % — AB (ref <5.7)
Mean Plasma Glucose: 117 mg/dL — ABNORMAL HIGH (ref <117)

## 2014-09-18 LAB — LIPID PANEL
CHOL/HDL RATIO: 3.1 ratio
Cholesterol: 132 mg/dL (ref 0–200)
HDL: 43 mg/dL (ref >=40)
LDL CALC: 74 mg/dL (ref 0–99)
Triglycerides: 73 mg/dL (ref <150)
VLDL: 15 mg/dL (ref 0–40)

## 2014-09-18 LAB — VITAMIN D 25 HYDROXY (VIT D DEFICIENCY, FRACTURES): Vit D, 25-Hydroxy: 51 ng/mL (ref 30–100)

## 2014-09-18 LAB — TSH: TSH: 0.401 u[IU]/mL (ref 0.350–4.500)

## 2014-09-18 LAB — VITAMIN B12: VITAMIN B 12: 488 pg/mL (ref 211–911)

## 2014-09-18 LAB — URINALYSIS, MICROSCOPIC ONLY
BACTERIA UA: NONE SEEN
Casts: NONE SEEN
Crystals: NONE SEEN
Squamous Epithelial / LPF: NONE SEEN

## 2014-09-18 LAB — MAGNESIUM: Magnesium: 1.5 mg/dL (ref 1.5–2.5)

## 2014-09-18 LAB — TESTOSTERONE: TESTOSTERONE: 342 ng/dL (ref 300–890)

## 2014-09-18 LAB — INSULIN, FASTING: Insulin fasting, serum: 15 u[IU]/mL (ref 2.0–19.6)

## 2014-09-24 ENCOUNTER — Other Ambulatory Visit: Payer: Self-pay | Admitting: Internal Medicine

## 2014-10-27 ENCOUNTER — Other Ambulatory Visit: Payer: Self-pay | Admitting: Physician Assistant

## 2015-03-22 ENCOUNTER — Ambulatory Visit (INDEPENDENT_AMBULATORY_CARE_PROVIDER_SITE_OTHER): Payer: Medicare Other | Admitting: Physician Assistant

## 2015-03-22 ENCOUNTER — Encounter: Payer: Self-pay | Admitting: Physician Assistant

## 2015-03-22 VITALS — BP 114/72 | HR 64 | Temp 99.5°F | Resp 16 | Ht 67.0 in | Wt 143.6 lb

## 2015-03-22 DIAGNOSIS — I10 Essential (primary) hypertension: Secondary | ICD-10-CM | POA: Diagnosis not present

## 2015-03-22 DIAGNOSIS — E782 Mixed hyperlipidemia: Secondary | ICD-10-CM | POA: Diagnosis not present

## 2015-03-22 DIAGNOSIS — R7309 Other abnormal glucose: Secondary | ICD-10-CM | POA: Diagnosis not present

## 2015-03-22 DIAGNOSIS — R7303 Prediabetes: Secondary | ICD-10-CM

## 2015-03-22 DIAGNOSIS — E559 Vitamin D deficiency, unspecified: Secondary | ICD-10-CM

## 2015-03-22 DIAGNOSIS — Z79899 Other long term (current) drug therapy: Secondary | ICD-10-CM | POA: Diagnosis not present

## 2015-03-22 LAB — CBC WITH DIFFERENTIAL/PLATELET
Basophils Absolute: 0.1 10*3/uL (ref 0.0–0.1)
Basophils Relative: 2 % — ABNORMAL HIGH (ref 0–1)
EOS PCT: 2 % (ref 0–5)
Eosinophils Absolute: 0.1 10*3/uL (ref 0.0–0.7)
HEMATOCRIT: 45.4 % (ref 39.0–52.0)
HEMOGLOBIN: 15.9 g/dL (ref 13.0–17.0)
Lymphocytes Relative: 26 % (ref 12–46)
Lymphs Abs: 1.2 10*3/uL (ref 0.7–4.0)
MCH: 28.9 pg (ref 26.0–34.0)
MCHC: 35 g/dL (ref 30.0–36.0)
MCV: 82.5 fL (ref 78.0–100.0)
MONOS PCT: 8 % (ref 3–12)
MPV: 10.5 fL (ref 8.6–12.4)
Monocytes Absolute: 0.4 10*3/uL (ref 0.1–1.0)
NEUTROS ABS: 2.8 10*3/uL (ref 1.7–7.7)
Neutrophils Relative %: 62 % (ref 43–77)
Platelets: 174 10*3/uL (ref 150–400)
RBC: 5.5 MIL/uL (ref 4.22–5.81)
RDW: 14.2 % (ref 11.5–15.5)
WBC: 4.5 10*3/uL (ref 4.0–10.5)

## 2015-03-22 MED ORDER — TRAZODONE HCL 150 MG PO TABS: 150.0000 mg | ORAL_TABLET | Freq: Every day | ORAL | Status: DC

## 2015-03-22 MED ORDER — CITALOPRAM HYDROBROMIDE 40 MG PO TABS: 40.0000 mg | ORAL_TABLET | Freq: Every day | ORAL | Status: DC

## 2015-03-22 NOTE — Patient Instructions (Addendum)
Use a dropper or use a cap to put olive oil,mineral oil or canola oil in the effected ear- 2-3 times a week. Let it soak for 20-30 min then you can take a shower or use a baby bulb with warm water to wash out the ear wax.  Do not use Qtips  Smoking Cessation, Tips for Success If you are ready to quit smoking, congratulations! You have chosen to help yourself be healthier. Cigarettes bring nicotine, tar, carbon monoxide, and other irritants into your body. Your lungs, heart, and blood vessels will be able to work better without these poisons. There are many different ways to quit smoking. Nicotine gum, nicotine patches, a nicotine inhaler, or nicotine nasal spray can help with physical craving. Hypnosis, support groups, and medicines help break the habit of smoking. WHAT THINGS CAN I DO TO MAKE QUITTING EASIER?  Here are some tips to help you quit for good:  Pick a date when you will quit smoking completely. Tell all of your friends and family about your plan to quit on that date.  Do not try to slowly cut down on the number of cigarettes you are smoking. Pick a quit date and quit smoking completely starting on that day.  Throw away all cigarettes.   Clean and remove all ashtrays from your home, work, and car.  On a card, write down your reasons for quitting. Carry the card with you and read it when you get the urge to smoke.  Cleanse your body of nicotine. Drink enough water and fluids to keep your urine clear or pale yellow. Do this after quitting to flush the nicotine from your body.  Learn to predict your moods. Do not let a bad situation be your excuse to have a cigarette. Some situations in your life might tempt you into wanting a cigarette.  Never have "just one" cigarette. It leads to wanting another and another. Remind yourself of your decision to quit.  Change habits associated with smoking. If you smoked while driving or when feeling stressed, try other activities to replace  smoking. Stand up when drinking your coffee. Brush your teeth after eating. Sit in a different chair when you read the paper. Avoid alcohol while trying to quit, and try to drink fewer caffeinated beverages. Alcohol and caffeine may urge you to smoke.  Avoid foods and drinks that can trigger a desire to smoke, such as sugary or spicy foods and alcohol.  Ask people who smoke not to smoke around you.  Have something planned to do right after eating or having a cup of coffee. For example, plan to take a walk or exercise.  Try a relaxation exercise to calm you down and decrease your stress. Remember, you may be tense and nervous for the first 2 weeks after you quit, but this will pass.  Find new activities to keep your hands busy. Play with a pen, coin, or rubber band. Doodle or draw things on paper.  Brush your teeth right after eating. This will help cut down on the craving for the taste of tobacco after meals. You can also try mouthwash.   Use oral substitutes in place of cigarettes. Try using lemon drops, carrots, cinnamon sticks, or chewing gum. Keep them handy so they are available when you have the urge to smoke.  When you have the urge to smoke, try deep breathing.  Designate your home as a nonsmoking area.  If you are a heavy smoker, ask your health care provider about  a prescription for nicotine chewing gum. It can ease your withdrawal from nicotine.  Reward yourself. Set aside the cigarette money you save and buy yourself something nice.  Look for support from others. Join a support group or smoking cessation program. Ask someone at home or at work to help you with your plan to quit smoking.  Always ask yourself, "Do I need this cigarette or is this just a reflex?" Tell yourself, "Today, I choose not to smoke," or "I do not want to smoke." You are reminding yourself of your decision to quit.  Do not replace cigarette smoking with electronic cigarettes (commonly called  e-cigarettes). The safety of e-cigarettes is unknown, and some may contain harmful chemicals.  If you relapse, do not give up! Plan ahead and think about what you will do the next time you get the urge to smoke. HOW WILL I FEEL WHEN I QUIT SMOKING? You may have symptoms of withdrawal because your body is used to nicotine (the addictive substance in cigarettes). You may crave cigarettes, be irritable, feel very hungry, cough often, get headaches, or have difficulty concentrating. The withdrawal symptoms are only temporary. They are strongest when you first quit but will go away within 10-14 days. When withdrawal symptoms occur, stay in control. Think about your reasons for quitting. Remind yourself that these are signs that your body is healing and getting used to being without cigarettes. Remember that withdrawal symptoms are easier to treat than the major diseases that smoking can cause.  Even after the withdrawal is over, expect periodic urges to smoke. However, these cravings are generally short lived and will go away whether you smoke or not. Do not smoke! WHAT RESOURCES ARE AVAILABLE TO HELP ME QUIT SMOKING? Your health care provider can direct you to community resources or hospitals for support, which may include:  Group support.  Education.  Hypnosis.  Therapy.   ADVANTAGES OF QUITTING SMOKING  Within 20 minutes, blood pressure decreases. Your pulse is at normal level.  After 8 hours, carbon monoxide levels in the blood return to normal. Your oxygen level increases.  After 24 hours, the chance of having a heart attack starts to decrease. Your breath, hair, and body stop smelling like smoke.  After 48 hours, damaged nerve endings begin to recover. Your sense of taste and smell improve.  After 72 hours, the body is virtually free of nicotine. Your bronchial tubes relax and breathing becomes easier.  After 2 to 12 weeks, lungs can hold more air. Exercise becomes easier and  circulation improves.  After 1 year, the risk of coronary heart disease is cut in half.  After 5 years, the risk of stroke falls to the same as a nonsmoker.  After 10 years, the risk of lung cancer is cut in half and the risk of other cancers decreases significantly.  After 15 years, the risk of coronary heart disease drops, usually to the level of a nonsmoker.  You will have extra money to spend on things other than cigarettes.
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
## 2015-03-23 LAB — HEPATIC FUNCTION PANEL
ALBUMIN: 4.6 g/dL (ref 3.6–5.1)
ALK PHOS: 37 U/L — AB (ref 40–115)
ALT: 11 U/L (ref 9–46)
AST: 17 U/L (ref 10–40)
BILIRUBIN INDIRECT: 0.6 mg/dL (ref 0.2–1.2)
Bilirubin, Direct: 0.1 mg/dL (ref <=0.2)
Total Bilirubin: 0.7 mg/dL (ref 0.2–1.2)
Total Protein: 7 g/dL (ref 6.1–8.1)

## 2015-03-23 LAB — BASIC METABOLIC PANEL WITH GFR
BUN: 14 mg/dL (ref 7–25)
CALCIUM: 9.2 mg/dL (ref 8.6–10.3)
CO2: 29 mmol/L (ref 20–31)
Chloride: 104 mmol/L (ref 98–110)
Creat: 0.92 mg/dL (ref 0.60–1.35)
GFR, Est Non African American: >89 mL/min (ref >=60)
Glucose, Bld: 118 mg/dL — ABNORMAL HIGH (ref 65–99)
Potassium: 4.3 mmol/L (ref 3.5–5.3)
Sodium: 142 mmol/L (ref 135–146)

## 2015-03-23 LAB — HEMOGLOBIN A1C
Hgb A1c MFr Bld: 5.4 % (ref <5.7)
MEAN PLASMA GLUCOSE: 108 mg/dL (ref <117)

## 2015-03-23 LAB — TSH: TSH: 0.656 u[IU]/mL (ref 0.350–4.500)

## 2015-03-23 LAB — LIPID PANEL
CHOLESTEROL: 171 mg/dL (ref 125–200)
HDL: 46 mg/dL (ref >=40)
LDL CALC: 114 mg/dL (ref <130)
TRIGLYCERIDES: 54 mg/dL (ref <150)
Total CHOL/HDL Ratio: 3.7 Ratio (ref <=5.0)
VLDL: 11 mg/dL (ref <30)

## 2015-03-23 LAB — INSULIN, FASTING: Insulin fasting, serum: 18.2 u[IU]/mL (ref 2.0–19.6)

## 2015-03-23 LAB — MAGNESIUM: Magnesium: 1.6 mg/dL (ref 1.5–2.5)

## 2015-07-21 ENCOUNTER — Encounter: Payer: Self-pay | Admitting: Internal Medicine

## 2015-09-29 ENCOUNTER — Encounter: Payer: Self-pay | Admitting: Internal Medicine

## 2015-10-15 ENCOUNTER — Encounter: Payer: Self-pay | Admitting: Internal Medicine

## 2015-10-15 ENCOUNTER — Ambulatory Visit (INDEPENDENT_AMBULATORY_CARE_PROVIDER_SITE_OTHER): Payer: Medicare Other | Admitting: Internal Medicine

## 2015-10-15 VITALS — BP 110/60 | HR 72 | Temp 97.5°F | Resp 16 | Ht 68.0 in | Wt 150.8 lb

## 2015-10-15 DIAGNOSIS — Z1212 Encounter for screening for malignant neoplasm of rectum: Secondary | ICD-10-CM

## 2015-10-15 DIAGNOSIS — R7309 Other abnormal glucose: Secondary | ICD-10-CM

## 2015-10-15 DIAGNOSIS — E782 Mixed hyperlipidemia: Secondary | ICD-10-CM | POA: Diagnosis not present

## 2015-10-15 DIAGNOSIS — B351 Tinea unguium: Secondary | ICD-10-CM

## 2015-10-15 DIAGNOSIS — S069X0S Unspecified intracranial injury without loss of consciousness, sequela: Secondary | ICD-10-CM

## 2015-10-15 DIAGNOSIS — F32A Depression, unspecified: Secondary | ICD-10-CM

## 2015-10-15 DIAGNOSIS — Z79899 Other long term (current) drug therapy: Secondary | ICD-10-CM | POA: Diagnosis not present

## 2015-10-15 DIAGNOSIS — F329 Major depressive disorder, single episode, unspecified: Secondary | ICD-10-CM | POA: Diagnosis not present

## 2015-10-15 DIAGNOSIS — R5383 Other fatigue: Secondary | ICD-10-CM

## 2015-10-15 DIAGNOSIS — E559 Vitamin D deficiency, unspecified: Secondary | ICD-10-CM | POA: Diagnosis not present

## 2015-10-15 DIAGNOSIS — I10 Essential (primary) hypertension: Secondary | ICD-10-CM

## 2015-10-15 DIAGNOSIS — R7303 Prediabetes: Secondary | ICD-10-CM

## 2015-10-15 LAB — CBC WITH DIFFERENTIAL/PLATELET
BASOS ABS: 0.1 10*3/uL (ref 0.0–0.1)
BASOS PCT: 1 % (ref 0–1)
EOS ABS: 0.3 10*3/uL (ref 0.0–0.7)
Eosinophils Relative: 6 % — ABNORMAL HIGH (ref 0–5)
HCT: 44.8 % (ref 39.0–52.0)
HEMOGLOBIN: 15.2 g/dL (ref 13.0–17.0)
Lymphocytes Relative: 34 % (ref 12–46)
Lymphs Abs: 1.9 10*3/uL (ref 0.7–4.0)
MCH: 28.9 pg (ref 26.0–34.0)
MCHC: 33.9 g/dL (ref 30.0–36.0)
MCV: 85.2 fL (ref 78.0–100.0)
MONOS PCT: 10 % (ref 3–12)
MPV: 11.3 fL (ref 8.6–12.4)
Monocytes Absolute: 0.6 10*3/uL (ref 0.1–1.0)
NEUTROS ABS: 2.7 10*3/uL (ref 1.7–7.7)
NEUTROS PCT: 49 % (ref 43–77)
PLATELETS: 218 10*3/uL (ref 150–400)
RBC: 5.26 MIL/uL (ref 4.22–5.81)
RDW: 13.9 % (ref 11.5–15.5)
WBC: 5.5 10*3/uL (ref 4.0–10.5)

## 2015-10-15 LAB — HEMOGLOBIN A1C
Hgb A1c MFr Bld: 5.4 % (ref <5.7)
MEAN PLASMA GLUCOSE: 108 mg/dL (ref <117)

## 2015-10-15 LAB — TSH: TSH: 0.91 mIU/L (ref 0.40–4.50)

## 2015-10-15 LAB — TESTOSTERONE: TESTOSTERONE: 464 ng/dL (ref 250–827)

## 2015-10-15 MED ORDER — TERBINAFINE HCL 250 MG PO TABS: 250.0000 mg | ORAL_TABLET | Freq: Every day | ORAL | Status: DC

## 2015-10-15 NOTE — Patient Instructions (Signed)
Recommend Adult Low Dose Aspirin or   coated  Aspirin 81 mg daily   To reduce risk of Colon Cancer 20 %,   Skin Cancer 26 % ,   Melanoma 46%   and   Pancreatic cancer 60%   ++++++++++++++++++++++++++++++++++++++++++++++++++++++ Vitamin D goal   is between 70-100.   Please make sure that you are taking your Vitamin D as directed.   It is very important as a natural anti-inflammatory   helping hair, skin, and nails, as well as reducing stroke and heart attack risk.   It helps your bones and helps with mood.  It also decreases numerous cancer risks so please take it as directed.   Low Vit D is associated with a 200-300% higher risk for CANCER   and 200-300% higher risk for HEART   ATTACK  &  STROKE.   .....................................[name]  It is also associated with higher death rate at younger ages,   autoimmune diseases like Rheumatoid arthritis, Lupus, Multiple Sclerosis.     Also many other serious conditions, like depression, Alzheimer's  Dementia, infertility, muscle aches, fatigue, fibromyalgia - just to name a few.  ++++++++++++++++++++++++++++++++++++++++++++++++  Recommend the book "The END of DIETING" by Dr Excell Seltzer   & the book "The END of DIABETES " by Dr Excell Seltzer  At [email or web address] - get book & Audio CD's     Being diabetic has a  300% increased risk for heart attack, stroke, cancer, and alzheimer- type vascular dementia. It is very important that you work harder with diet by avoiding all foods that are white. Avoid white rice (brown & wild rice is OK), white potatoes (sweetpotatoes in moderation is OK), White bread or wheat bread or anything made out of white flour like bagels, donuts, rolls, buns, biscuits, cakes, pastries, cookies, pizza crust, and pasta (made from white flour & egg whites) - vegetarian pasta or spinach or wheat pasta is OK. Multigrain breads like Arnold's or Pepperidge [employer], or multigrain sandwich thins or flatbreads.  Diet,  exercise and weight loss can reverse and cure diabetes in the early stages.  Diet, exercise and weight loss is very important in the control and prevention of complications of diabetes which affects every system in your body, ie. Brain - dementia/stroke, eyes - glaucoma/blindness, heart - heart attack/heart failure, kidneys - dialysis, stomach - gastric paralysis, intestines - malabsorption, nerves - severe painful neuritis, circulation - gangrene & loss of a leg(s), and finally cancer and Alzheimers.    I recommend avoid fried & greasy foods,  sweets/candy, white rice (brown or wild rice or Quinoa is OK), white potatoes (sweet potatoes are OK) - anything made from white flour - bagels, doughnuts, rolls, buns, biscuits,white and wheat breads, pizza crust and traditional pasta made of white flour & egg white(vegetarian pasta or spinach or wheat pasta is OK).  Multi-grain bread is OK - like multi-grain flat bread or sandwich thins. Avoid alcohol in excess. Exercise is also important.    Eat all the vegetables you want - avoid meat, especially red meat and dairy - especially cheese.  Cheese is the most concentrated form of trans-fats which is the worst thing to clog up our arteries. Veggie cheese is OK which can be found in the fresh produce section at [employer] or [employer] or [employer]  ++++++++++++++++++++++++++++++++++++++++++++++++++ DASH Eating Plan  DASH stands for "Dietary Approaches to Stop Hypertension."   The DASH eating plan is a healthy eating plan that has been shown to reduce high blood  pressure (hypertension). Additional health benefits may include reducing the risk of type 2 diabetes mellitus, heart disease, and stroke. The DASH eating plan may also help with weight loss.  WHAT DO I NEED TO KNOW ABOUT THE DASH EATING PLAN?  For the DASH eating plan, you will follow these general guidelines:  Choose foods with a percent daily value for sodium of less than 5% (as listed on the food  label).  Use salt-free seasonings or herbs instead of table salt or sea salt.  Check with your health care provider or pharmacist before using salt substitutes.  Eat lower-sodium products, often labeled as "lower sodium" or "no salt added."  Eat fresh foods.  Eat more vegetables, fruits, and low-fat dairy products.    Choose whole grains. Look for the word "whole" as the first word in the ingredient list.  Choose fish   Limit sweets, desserts, sugars, and sugary drinks.  Choose heart-healthy fats.  Eat veggie cheese   Eat more home-cooked food and less restaurant, buffet, and fast food.  Limit fried foods.  [place]aker foods using methods other than frying.  Limit canned vegetables. If you do use them, rinse them well to decrease the sodium.  When eating at a restaurant, ask that your food be prepared with less salt, or no salt if possible.                      WHAT FOODS CAN I EAT?  Read Dr Fara Olden Fuhrman's books on The End of Dieting & The End of Diabetes  Grains  Whole grain or whole wheat bread. Brown rice. Whole grain or whole wheat pasta. Quinoa, bulgur, and whole grain cereals. Low-sodium cereals. Corn or whole wheat flour tortillas. Whole grain cornbread. Whole grain crackers. Low-sodium crackers.  Vegetables  Fresh or frozen vegetables (raw, steamed, roasted, or grilled). Low-sodium or reduced-sodium tomato and vegetable juices. Low-sodium or reduced-sodium tomato sauce and paste. Low-sodium or reduced-sodium canned vegetables.   Fruits  All fresh, canned (in natural juice), or frozen fruits.  Protein Products   All fish and seafood.  Dried beans, peas, or lentils. Unsalted nuts and seeds. Unsalted canned beans.  Dairy  Low-fat dairy products, such as skim or 1% milk, 2% or reduced-fat cheeses, low-fat ricotta or cottage cheese, or plain low-fat yogurt. Low-sodium or reduced-sodium cheeses.  Fats and Oils  Tub margarines without trans fats. Light or  reduced-fat mayonnaise and salad dressings (reduced sodium). Avocado. Safflower, olive, or canola oils. Natural peanut or almond butter.  Other  Unsalted popcorn and pretzels. The items listed above may not be a complete list of recommended foods or beverages. Contact your dietitian for more options.  +++++++++++++++++++++++++++++++++++++++++++  WHAT FOODS ARE NOT RECOMMENDED?  Grains/ White flour or wheat flour  White bread. White pasta. White rice. Refined cornbread. Bagels and croissants. Crackers that contain trans fat.  Vegetables  Creamed or fried vegetables. Vegetables in a . Regular canned vegetables. Regular canned tomato sauce and paste. Regular tomato and vegetable juices.  Fruits  Dried fruits. Canned fruit in light or heavy syrup. Fruit juice.  Meat and Other Protein Products  Meat in general - RED mwaet & White meat.  Fatty cuts of meat. Ribs, chicken wings, bacon, sausage, bologna, salami, chitterlings, fatback, hot dogs, bratwurst, and packaged luncheon meats.  Dairy  Whole or 2% milk, cream, half-and-half, and cream cheese. Whole-fat or sweetened yogurt. Full-fat cheeses or blue cheese. Nondairy creamers and whipped toppings. Processed cheese, cheese spreads, or  cheese curds.  Condiments  Onion and garlic salt, seasoned salt, table salt, and sea salt. Canned and packaged gravies. Worcestershire sauce. Tartar sauce. Barbecue sauce. Teriyaki sauce. Soy sauce, including reduced sodium. Steak sauce. Fish sauce. Oyster sauce. Cocktail sauce. Horseradish. Ketchup and mustard. Meat flavorings and tenderizers. Bouillon cubes. Hot sauce. Tabasco sauce. Marinades. Taco seasonings. Relishes.  Fats and Oils Butter, stick margarine, lard, shortening and bacon fat. Coconut, palm kernel, or palm oils. Regular salad dressings.  Pickles and olives. Salted popcorn and pretzels.  The items listed above may not be a complete list of foods and beverages to  avoid.  ++++++++++++++++++++++++++++++++++++++  Preventive Care for Adults  A healthy lifestyle and preventive care can promote health and wellness. Preventive health guidelines for men include the following key practices:  A routine yearly physical is a good way to check with your health care provider about your health and preventative screening. It is a chance to share any concerns and updates on your health and to receive a thorough exam.  Visit your dentist for a routine exam and preventative care every 6 months. Brush your teeth twice a day and floss once a day. Good oral hygiene prevents tooth decay and gum disease.  The frequency of eye exams is based on your age, health, family medical history, use of contact lenses, and other factors. Follow your health care provider's recommendations for frequency of eye exams.  Eat a healthy diet. Foods such as vegetables, fruits, whole grains, low-fat dairy products, and lean protein foods contain the nutrients you need without too many calories. Decrease your intake of foods high in solid fats, added sugars, and salt. Eat the right amount of calories for you.Get information about a proper diet from your health care provider, if necessary.  Regular physical exercise is one of the most important things you can do for your health. Most adults should get at least 150 minutes of moderate-intensity exercise (any activity that increases your heart rate and causes you to sweat) each week. In addition, most adults need muscle-strengthening exercises on 2 or more days a week.  Maintain a healthy weight. The body mass index (BMI) is a screening tool to identify possible weight problems. It provides an estimate of body fat based on height and weight. Your health care provider can find your BMI and can help you achieve or maintain a healthy weight.For adults 20 years and older:  A BMI below 18.5 is considered underweight.  A BMI of 18.5 to 24.9 is  normal.  A BMI of 25 to 29.9 is considered overweight.  A BMI of 30 and above is considered obese.  Maintain normal blood lipids and cholesterol levels by exercising and minimizing your intake of saturated fat. Eat a balanced diet with plenty of fruit and vegetables. Blood tests for lipids and cholesterol should begin at age 20 and be repeated every 5 years. If your lipid or cholesterol levels are high, you are over 50, or you are at high risk for heart disease, you may need your cholesterol levels checked more frequently.Ongoing high lipid and cholesterol levels should be treated with medicines if diet and exercise are not working.  If you smoke, find out from your health care provider how to quit. If you do not use tobacco, do not start.  Lung cancer screening is recommended for adults aged 55-80 years who are at high risk for developing lung cancer because of a history of smoking. A yearly low-dose CT scan of the lungs   is recommended for people who have at least a 30-pack-year history of smoking and are a current smoker or have quit within the past 15 years. A pack year of smoking is smoking an average of 1 pack of cigarettes a day for 1 year (for example: 1 pack a day for 30 years or 2 packs a day for 15 years). Yearly screening should continue until the smoker has stopped smoking for at least 15 years. Yearly screening should be stopped for people who develop a health problem that would prevent them from having lung cancer treatment.  If you choose to drink alcohol, do not have more than 2 drinks per day. One drink is considered to be 12 ounces (355 mL) of beer, 5 ounces (148 mL) of wine, or 1.5 ounces (44 mL) of liquor.  High blood pressure causes heart disease and increases the risk of stroke. Your blood pressure should be checked. Ongoing high blood pressure should be treated with medicines, if weight loss and exercise are not effective.  If you are 45-79 years old, ask your health care  provider if you should take aspirin to prevent heart disease.  Diabetes screening involves taking a blood sample to check your fasting blood sugar level. Testing should be considered at a younger age or be carried out more frequently if you are overweight and have at least 1 risk factor for diabetes.  Colorectal cancer can be detected and often prevented. Most routine colorectal cancer screening begins at the age of 50 and continues through age 75. However, your health care provider may recommend screening at an earlier age if you have risk factors for colon cancer. On a yearly basis, your health care provider may provide home test kits to check for hidden blood in the stool. Use of a small camera at the end of a tube to directly examine the colon (sigmoidoscopy or colonoscopy) can detect the earliest forms of colorectal cancer. Talk to your health care provider about this at age 50, when routine screening begins. Direct exam of the colon should be repeated every 5-10 years through age 75, unless early forms of precancerous polyps or small growths are found.  Screening for abdominal aortic aneurysm (AAA)  are recommended for persons over age 50 who have history of hypertensionor who are current or former smokers.  Talk with your health care provider about prostate cancer screening.  Testicular cancer screening is recommended for adult males. Screening includes self-exam, a health care provider exam, and other screening tests. Consult with your health care provider about any symptoms you have or any concerns you have about testicular cancer.  Use sunscreen. Apply sunscreen liberally and repeatedly throughout the day. You should seek shade when your shadow is shorter than you. Protect yourself by wearing long sleeves, pants, a wide-brimmed hat, and sunglasses year round, whenever you are outdoors.  Once a month, do a whole-body skin exam, using a mirror to look at the skin on your back. Tell your health  care provider about new moles, moles that have irregular borders, moles that are larger than a pencil eraser, or moles that have changed in shape or color.  Stay current with required vaccines (immunizations).  Influenza vaccine. All adults should be immunized every year.  Tetanus, diphtheria, and acellular pertussis (Td, Tdap) vaccine. An adult who has not previously received Tdap or who does not know his vaccine status should receive 1 dose of Tdap. This initial dose should be followed by tetanus and diphtheria toxoids (Td)   booster doses every 10 years. Adults with an unknown or incomplete history of completing a 3-dose immunization series with Td-containing vaccines should begin or complete a primary immunization series including a Tdap dose. Adults should receive a Td booster every 10 years.  Zoster vaccine. One dose is recommended for adults aged 60 years or older unless certain conditions are present.    Pneumococcal 13-valent conjugate (PCV13) vaccine. When indicated, a person who is uncertain of his immunization history and has no record of immunization should receive the PCV13 vaccine. An adult aged 19 years or older who has certain medical conditions and has not been previously immunized should receive 1 dose of PCV13 vaccine. This PCV13 should be followed with a dose of pneumococcal polysaccharide (PPSV23) vaccine. The PPSV23 vaccine dose should be obtained at least 8 weeks after the dose of PCV13 vaccine. An adult aged 19 years or older who has certain medical conditions and previously received 1 or more doses of PPSV23 vaccine should receive 1 dose of PCV13. The PCV13 vaccine dose should be obtained 1 or more years after the last PPSV23 vaccine dose.    Pneumococcal polysaccharide (PPSV23) vaccine. When PCV13 is also indicated, PCV13 should be obtained first. All adults aged 65 years and older should be immunized. An adult younger than age 65 years who has certain medical conditions  should be immunized. Any person who resides in a nursing home or long-term care facility should be immunized. An adult smoker should be immunized. People with an immunocompromised condition and certain other conditions should receive both PCV13 and PPSV23 vaccines. People with human immunodeficiency virus (HIV) infection should be immunized as soon as possible after diagnosis. Immunization during chemotherapy or radiation therapy should be avoided. Routine use of PPSV23 vaccine is not recommended for American Indians, [place] Natives, or people younger than 65 years unless there are medical conditions that require PPSV23 vaccine. When indicated, people who have unknown immunization and have no record of immunization should receive PPSV23 vaccine. One-time revaccination 5 years after the first dose of PPSV23 is recommended for people aged 19-64 years who have chronic kidney failure, nephrotic syndrome, asplenia, or immunocompromised conditions. People who received 1-2 doses of PPSV23 before age 65 years should receive another dose of PPSV23 vaccine at age 65 years or later if at least 5 years have passed since the previous dose. Doses of PPSV23 are not needed for people immunized with PPSV23 at or after age 65 years.  Hepatitis A vaccine. Adults who wish to be protected from this disease, have certain high-risk conditions, work with hepatitis A-infected animals, work in hepatitis A research labs, or travel to or work in countries with a high rate of hepatitis A should be immunized. Adults who were previously unvaccinated and who anticipate close contact with an international adoptee during the first 60 days after arrival in the [place] from a country with a high rate of hepatitis A should be immunized.  Hepatitis B vaccine. Adults should be immunized if they wish to be protected from this disease, have certain high-risk conditions, may be exposed to blood or other infectious body fluids, are household  contacts or sex partners of hepatitis B positive people, are clients or workers in certain care facilities, or travel to or work in countries with a high rate of hepatitis B.  Preventive Service / Frequency  Ages 19 to 39  Blood pressure check.  Lipid and cholesterol check.  Hepatitis C blood test.** / For any individual with known risks   for hepatitis C.  Skin self-exam. / Monthly.  Influenza vaccine. / Every year.  Tetanus, diphtheria, and acellular pertussis (Tdap, Td) vaccine.** / Consult your health care provider. 1 dose of Td every 10 years.  HPV vaccine. / 3 doses over 6 months, if 26 or younger.  Measles, mumps, rubella (MMR) vaccine.** / You need at least 1 dose of MMR if you were born in 1957 or later. You may also need a second dose.  Pneumococcal 13-valent conjugate (PCV13) vaccine.** / Consult your health care provider.  Pneumococcal polysaccharide (PPSV23) vaccine.** / 1 to 2 doses if you smoke cigarettes or if you have certain conditions.  Meningococcal vaccine.** / 1 dose if you are age 19 to 21 years and a first-year college student living in a residence hall, or have one of several medical conditions. You may also need additional booster doses.  Hepatitis A vaccine.** / Consult your health care provider.  Hepatitis B vaccine.** / Consult your health care provider.   
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
## 2015-10-16 LAB — HEPATIC FUNCTION PANEL
ALBUMIN: 4.5 g/dL (ref 3.6–5.1)
ALT: 9 U/L (ref 9–46)
AST: 18 U/L (ref 10–40)
Alkaline Phosphatase: 40 U/L (ref 40–115)
Bilirubin, Direct: 0.2 mg/dL (ref <=0.2)
Indirect Bilirubin: 0.5 mg/dL (ref 0.2–1.2)
TOTAL PROTEIN: 6.6 g/dL (ref 6.1–8.1)
Total Bilirubin: 0.7 mg/dL (ref 0.2–1.2)

## 2015-10-16 LAB — BASIC METABOLIC PANEL WITH GFR
BUN: 10 mg/dL (ref 7–25)
CALCIUM: 9.4 mg/dL (ref 8.6–10.3)
CHLORIDE: 104 mmol/L (ref 98–110)
CO2: 29 mmol/L (ref 20–31)
CREATININE: 0.91 mg/dL (ref 0.60–1.35)
GLUCOSE: 70 mg/dL (ref 65–99)
POTASSIUM: 4.1 mmol/L (ref 3.5–5.3)
SODIUM: 144 mmol/L (ref 135–146)

## 2015-10-16 LAB — VITAMIN D 25 HYDROXY (VIT D DEFICIENCY, FRACTURES): Vit D, 25-Hydroxy: 31 ng/mL (ref 30–100)

## 2015-10-16 LAB — MAGNESIUM: Magnesium: 1.8 mg/dL (ref 1.5–2.5)

## 2015-10-16 LAB — LIPID PANEL
CHOLESTEROL: 149 mg/dL (ref 125–200)
HDL: 47 mg/dL (ref >=40)
LDL Cholesterol: 90 mg/dL (ref <130)
TRIGLYCERIDES: 58 mg/dL (ref <150)
Total CHOL/HDL Ratio: 3.2 Ratio (ref <=5.0)
VLDL: 12 mg/dL (ref <30)

## 2015-10-16 LAB — MICROALBUMIN / CREATININE URINE RATIO
Creatinine, Urine: 268 mg/dL (ref 20–370)
MICROALB/CREAT RATIO: 2 ug/mg{creat} (ref <30)
Microalb, Ur: 0.6 mg/dL

## 2015-10-18 LAB — INSULIN, RANDOM: INSULIN: 8.8 u[IU]/mL (ref 2.0–19.6)

## 2015-11-17 ENCOUNTER — Other Ambulatory Visit: Payer: Self-pay | Admitting: Physician Assistant

## 2015-12-01 ENCOUNTER — Ambulatory Visit: Payer: Self-pay | Admitting: Internal Medicine

## 2016-03-16 ENCOUNTER — Other Ambulatory Visit: Payer: Self-pay | Admitting: Internal Medicine

## 2016-03-16 DIAGNOSIS — B351 Tinea unguium: Secondary | ICD-10-CM

## 2016-04-19 ENCOUNTER — Ambulatory Visit: Payer: Self-pay | Admitting: Internal Medicine

## 2016-05-01 ENCOUNTER — Other Ambulatory Visit: Payer: Self-pay | Admitting: Internal Medicine

## 2016-05-25 ENCOUNTER — Encounter: Payer: Self-pay | Admitting: Internal Medicine

## 2016-05-25 ENCOUNTER — Ambulatory Visit (INDEPENDENT_AMBULATORY_CARE_PROVIDER_SITE_OTHER): Payer: Medicare Other | Admitting: Internal Medicine

## 2016-05-25 VITALS — BP 136/80 | HR 60 | Temp 98.0°F | Resp 16 | Ht 67.0 in | Wt 152.0 lb

## 2016-05-25 DIAGNOSIS — I10 Essential (primary) hypertension: Secondary | ICD-10-CM

## 2016-05-25 DIAGNOSIS — S069X0S Unspecified intracranial injury without loss of consciousness, sequela: Secondary | ICD-10-CM

## 2016-05-25 DIAGNOSIS — E559 Vitamin D deficiency, unspecified: Secondary | ICD-10-CM

## 2016-05-25 DIAGNOSIS — F329 Major depressive disorder, single episode, unspecified: Secondary | ICD-10-CM

## 2016-05-25 DIAGNOSIS — E782 Mixed hyperlipidemia: Secondary | ICD-10-CM | POA: Diagnosis not present

## 2016-05-25 DIAGNOSIS — R7303 Prediabetes: Secondary | ICD-10-CM | POA: Diagnosis not present

## 2016-05-25 DIAGNOSIS — B351 Tinea unguium: Secondary | ICD-10-CM | POA: Diagnosis not present

## 2016-05-25 DIAGNOSIS — F32A Depression, unspecified: Secondary | ICD-10-CM

## 2016-05-25 MED ORDER — TRAZODONE HCL 150 MG PO TABS: 150.0000 mg | ORAL_TABLET | Freq: Every day | ORAL | 1 refills | Status: DC

## 2016-05-25 MED ORDER — CITALOPRAM HYDROBROMIDE 20 MG PO TABS: ORAL_TABLET | ORAL | 1 refills | Status: DC

## 2016-05-25 MED ORDER — TERBINAFINE HCL 250 MG PO TABS: 250.0000 mg | ORAL_TABLET | Freq: Every day | ORAL | 0 refills | Status: DC
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
## 2016-05-25 NOTE — Patient Instructions (Signed)
Please take lamasil once daily for a month.  Then take a month off.  Repeat this for the next six months.  This will ensure that you do not have a recurrence of the toe nail fungus.  Please start taking 20 mg of celexa instead of 40 mg.  If you have mood swings or difficulty sleeping call me and let me know.

## 2016-11-11 NOTE — Progress Notes (Signed)
R  E  S  H  E  D  U  L  E  D 

## 2016-11-13 ENCOUNTER — Ambulatory Visit: Payer: Self-pay | Admitting: Internal Medicine

## 2016-12-14 ENCOUNTER — Ambulatory Visit (INDEPENDENT_AMBULATORY_CARE_PROVIDER_SITE_OTHER): Payer: Medicare Other | Admitting: Internal Medicine

## 2016-12-14 ENCOUNTER — Encounter: Payer: Self-pay | Admitting: Internal Medicine

## 2016-12-14 VITALS — BP 104/72 | HR 56 | Temp 97.3°F | Resp 16 | Ht 67.75 in | Wt 157.0 lb

## 2016-12-14 DIAGNOSIS — R7303 Prediabetes: Secondary | ICD-10-CM | POA: Diagnosis not present

## 2016-12-14 DIAGNOSIS — E559 Vitamin D deficiency, unspecified: Secondary | ICD-10-CM

## 2016-12-14 DIAGNOSIS — I10 Essential (primary) hypertension: Secondary | ICD-10-CM

## 2016-12-14 DIAGNOSIS — Z136 Encounter for screening for cardiovascular disorders: Secondary | ICD-10-CM

## 2016-12-14 DIAGNOSIS — S069X0S Unspecified intracranial injury without loss of consciousness, sequela: Secondary | ICD-10-CM

## 2016-12-14 DIAGNOSIS — R7309 Other abnormal glucose: Secondary | ICD-10-CM

## 2016-12-14 DIAGNOSIS — Z79899 Other long term (current) drug therapy: Secondary | ICD-10-CM

## 2016-12-14 DIAGNOSIS — Z1212 Encounter for screening for malignant neoplasm of rectum: Secondary | ICD-10-CM

## 2016-12-14 DIAGNOSIS — Z111 Encounter for screening for respiratory tuberculosis: Secondary | ICD-10-CM | POA: Diagnosis not present

## 2016-12-14 DIAGNOSIS — E782 Mixed hyperlipidemia: Secondary | ICD-10-CM

## 2016-12-14 LAB — LIPID PANEL
Cholesterol: 139 mg/dL (ref <200)
HDL: 42 mg/dL (ref >40)
LDL CALC: 87 mg/dL (ref <100)
TRIGLYCERIDES: 52 mg/dL (ref <150)
Total CHOL/HDL Ratio: 3.3 Ratio (ref <5.0)
VLDL: 10 mg/dL (ref <30)

## 2016-12-14 LAB — CBC WITH DIFFERENTIAL/PLATELET
BASOS PCT: 1 %
Basophils Absolute: 49 cells/uL (ref 0–200)
EOS PCT: 5 %
Eosinophils Absolute: 245 cells/uL (ref 15–500)
HEMATOCRIT: 45 % (ref 38.5–50.0)
HEMOGLOBIN: 15 g/dL (ref 13.2–17.1)
LYMPHS PCT: 32 %
Lymphs Abs: 1568 cells/uL (ref 850–3900)
MCH: 28.4 pg (ref 27.0–33.0)
MCHC: 33.3 g/dL (ref 32.0–36.0)
MCV: 85.1 fL (ref 80.0–100.0)
MPV: 10.5 fL (ref 7.5–12.5)
Monocytes Absolute: 441 cells/uL (ref 200–950)
Monocytes Relative: 9 %
NEUTROS ABS: 2597 {cells}/uL (ref 1500–7800)
NEUTROS PCT: 53 %
Platelets: 181 10*3/uL (ref 140–400)
RBC: 5.29 MIL/uL (ref 4.20–5.80)
RDW: 13.8 % (ref 11.0–15.0)
WBC: 4.9 10*3/uL (ref 3.8–10.8)

## 2016-12-14 LAB — BASIC METABOLIC PANEL WITH GFR
BUN: 11 mg/dL (ref 7–25)
CALCIUM: 9.6 mg/dL (ref 8.6–10.3)
CO2: 29 mmol/L (ref 20–31)
Chloride: 105 mmol/L (ref 98–110)
Creat: 0.99 mg/dL (ref 0.60–1.35)
GLUCOSE: 92 mg/dL (ref 65–99)
POTASSIUM: 4.3 mmol/L (ref 3.5–5.3)
SODIUM: 143 mmol/L (ref 135–146)

## 2016-12-14 LAB — HEPATIC FUNCTION PANEL
ALBUMIN: 4.3 g/dL (ref 3.6–5.1)
ALK PHOS: 40 U/L (ref 40–115)
ALT: 9 U/L (ref 9–46)
AST: 14 U/L (ref 10–40)
BILIRUBIN TOTAL: 0.6 mg/dL (ref 0.2–1.2)
Bilirubin, Direct: 0.1 mg/dL (ref <=0.2)
Indirect Bilirubin: 0.5 mg/dL (ref 0.2–1.2)
TOTAL PROTEIN: 6.5 g/dL (ref 6.1–8.1)

## 2016-12-14 LAB — TSH: TSH: 1.7 mIU/L (ref 0.40–4.50)
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
## 2016-12-14 NOTE — Patient Instructions (Signed)

## 2016-12-15 LAB — MAGNESIUM: Magnesium: 1.7 mg/dL (ref 1.5–2.5)

## 2016-12-15 LAB — MICROALBUMIN / CREATININE URINE RATIO
Creatinine, Urine: 187 mg/dL (ref 20–370)
MICROALB UR: 0.3 mg/dL
MICROALB/CREAT RATIO: 2 ug/mg{creat} (ref <30)

## 2016-12-15 LAB — URINALYSIS, ROUTINE W REFLEX MICROSCOPIC
Bilirubin Urine: NEGATIVE
Glucose, UA: NEGATIVE
HGB URINE DIPSTICK: NEGATIVE
Ketones, ur: NEGATIVE
LEUKOCYTES UA: NEGATIVE
NITRITE: NEGATIVE
PH: 7 (ref 5.0–8.0)
Protein, ur: NEGATIVE
Specific Gravity, Urine: 1.02 (ref 1.001–1.035)

## 2016-12-15 LAB — INSULIN, RANDOM: Insulin: 3.9 u[IU]/mL (ref 2.0–19.6)

## 2016-12-15 LAB — VITAMIN D 25 HYDROXY (VIT D DEFICIENCY, FRACTURES): Vit D, 25-Hydroxy: 68 ng/mL (ref 30–100)

## 2016-12-15 LAB — HEMOGLOBIN A1C
HEMOGLOBIN A1C: 5 % (ref <5.7)
MEAN PLASMA GLUCOSE: 97 mg/dL

## 2016-12-19 LAB — TB SKIN TEST
INDURATION: 0 mm
TB SKIN TEST: NEGATIVE

## 2017-01-29 ENCOUNTER — Other Ambulatory Visit: Payer: Self-pay | Admitting: Internal Medicine

## 2017-03-15 ENCOUNTER — Other Ambulatory Visit: Payer: Self-pay | Admitting: *Deleted

## 2017-03-15 MED ORDER — TRAZODONE HCL 150 MG PO TABS: 150.0000 mg | ORAL_TABLET | Freq: Every day | ORAL | 0 refills | Status: DC

## 2017-04-01 ENCOUNTER — Other Ambulatory Visit: Payer: Self-pay | Admitting: Internal Medicine

## 2017-06-19 ENCOUNTER — Ambulatory Visit (INDEPENDENT_AMBULATORY_CARE_PROVIDER_SITE_OTHER): Payer: Medicare Other | Admitting: Physician Assistant

## 2017-06-19 ENCOUNTER — Encounter: Payer: Self-pay | Admitting: Physician Assistant

## 2017-06-19 VITALS — BP 110/76 | HR 59 | Temp 97.7°F | Resp 16 | Ht 67.75 in | Wt 167.4 lb

## 2017-06-19 DIAGNOSIS — E782 Mixed hyperlipidemia: Secondary | ICD-10-CM | POA: Diagnosis not present

## 2017-06-19 DIAGNOSIS — Z0000 Encounter for general adult medical examination without abnormal findings: Secondary | ICD-10-CM

## 2017-06-19 DIAGNOSIS — S069X0S Unspecified intracranial injury without loss of consciousness, sequela: Secondary | ICD-10-CM

## 2017-06-19 DIAGNOSIS — I10 Essential (primary) hypertension: Secondary | ICD-10-CM | POA: Diagnosis not present

## 2017-06-19 DIAGNOSIS — R6889 Other general symptoms and signs: Secondary | ICD-10-CM

## 2017-06-19 DIAGNOSIS — R7303 Prediabetes: Secondary | ICD-10-CM | POA: Diagnosis not present

## 2017-06-19 DIAGNOSIS — Z6825 Body mass index (BMI) 25.0-25.9, adult: Secondary | ICD-10-CM

## 2017-06-19 DIAGNOSIS — Z7189 Other specified counseling: Secondary | ICD-10-CM | POA: Diagnosis not present

## 2017-06-19 DIAGNOSIS — L709 Acne, unspecified: Secondary | ICD-10-CM | POA: Diagnosis not present

## 2017-06-19 DIAGNOSIS — F1021 Alcohol dependence, in remission: Secondary | ICD-10-CM

## 2017-06-19 DIAGNOSIS — E559 Vitamin D deficiency, unspecified: Secondary | ICD-10-CM | POA: Diagnosis not present

## 2017-06-19 DIAGNOSIS — Z79899 Other long term (current) drug therapy: Secondary | ICD-10-CM | POA: Diagnosis not present

## 2017-06-19 DIAGNOSIS — Z0001 Encounter for general adult medical examination with abnormal findings: Secondary | ICD-10-CM

## 2017-06-19 DIAGNOSIS — F3341 Major depressive disorder, recurrent, in partial remission: Secondary | ICD-10-CM | POA: Diagnosis not present

## 2017-06-19 DIAGNOSIS — R569 Unspecified convulsions: Secondary | ICD-10-CM

## 2017-06-19 LAB — LIPID PANEL
CHOL/HDL RATIO: 3.6 (calc) (ref <5.0)
Cholesterol: 168 mg/dL (ref <200)
HDL: 47 mg/dL (ref >40)
LDL Cholesterol (Calc): 106 mg/dL (calc) — ABNORMAL HIGH
NON-HDL CHOLESTEROL (CALC): 121 mg/dL (ref <130)
Triglycerides: 65 mg/dL (ref <150)

## 2017-06-19 LAB — BASIC METABOLIC PANEL WITH GFR
BUN: 11 mg/dL (ref 7–25)
CO2: 30 mmol/L (ref 20–32)
CREATININE: 1.08 mg/dL (ref 0.60–1.35)
Calcium: 9.4 mg/dL (ref 8.6–10.3)
Chloride: 104 mmol/L (ref 98–110)
GFR, EST AFRICAN AMERICAN: 103 mL/min/{1.73_m2} (ref >=60)
GFR, EST NON AFRICAN AMERICAN: 88 mL/min/{1.73_m2} (ref >=60)
Glucose, Bld: 112 mg/dL — ABNORMAL HIGH (ref 65–99)
Potassium: 4.5 mmol/L (ref 3.5–5.3)
SODIUM: 140 mmol/L (ref 135–146)

## 2017-06-19 LAB — HEPATIC FUNCTION PANEL
AG RATIO: 2.1 (calc) (ref 1.0–2.5)
ALKALINE PHOSPHATASE (APISO): 42 U/L (ref 40–115)
ALT: 10 U/L (ref 9–46)
AST: 15 U/L (ref 10–40)
Albumin: 4.7 g/dL (ref 3.6–5.1)
BILIRUBIN INDIRECT: 0.6 mg/dL (ref 0.2–1.2)
BILIRUBIN TOTAL: 0.7 mg/dL (ref 0.2–1.2)
Bilirubin, Direct: 0.1 mg/dL (ref 0.0–0.2)
Globulin: 2.2 g/dL (calc) (ref 1.9–3.7)
Total Protein: 6.9 g/dL (ref 6.1–8.1)

## 2017-06-19 LAB — CBC WITH DIFFERENTIAL/PLATELET
BASOS PCT: 1.7 %
Basophils Absolute: 71 cells/uL (ref 0–200)
EOS ABS: 202 {cells}/uL (ref 15–500)
EOS PCT: 4.8 %
HCT: 47.6 % (ref 38.5–50.0)
Hemoglobin: 16.1 g/dL (ref 13.2–17.1)
Lymphs Abs: 1407 cells/uL (ref 850–3900)
MCH: 28.3 pg (ref 27.0–33.0)
MCHC: 33.8 g/dL (ref 32.0–36.0)
MCV: 83.8 fL (ref 80.0–100.0)
MONOS PCT: 7.7 %
MPV: 10.9 fL (ref 7.5–12.5)
Neutro Abs: 2197 cells/uL (ref 1500–7800)
Neutrophils Relative %: 52.3 %
Platelets: 206 10*3/uL (ref 140–400)
RBC: 5.68 10*6/uL (ref 4.20–5.80)
RDW: 12.8 % (ref 11.0–15.0)
TOTAL LYMPHOCYTE: 33.5 %
WBC mixed population: 323 cells/uL (ref 200–950)
WBC: 4.2 10*3/uL (ref 3.8–10.8)

## 2017-06-19 LAB — TSH: TSH: 1.54 mIU/L (ref 0.40–4.50)

## 2017-06-19 MED ORDER — TRAZODONE HCL 150 MG PO TABS: 150.0000 mg | ORAL_TABLET | Freq: Every day | ORAL | 1 refills | Status: DC

## 2017-06-19 NOTE — Patient Instructions (Signed)
HPV (Human Papillomavirus) Vaccine: What You Need to Know  1. Why get vaccinated?  HPV vaccine prevents infection with human papillomavirus (HPV) types that are associated with many cancers, including:  · cervical cancer in females,  · vaginal and vulvar cancers in females,  · anal cancer in females and males,  · throat cancer in females and males, and  · penile cancer in males.    In addition, HPV vaccine prevents infection with HPV types that cause genital warts in both females and males.  In the [place] about 12,000 women get cervical cancer every year, and about 4,000 women die from it. HPV vaccine can prevent most of these cases of cervical cancer.  Vaccination is not a substitute for cervical cancer screening. This vaccine does not protect against all HPV types that can cause cervical cancer. Women should still get regular Pap tests.  HPV infection usually comes from sexual contact, and most people will become infected at some point in their life. About 14 million Americans, including teens, get infected every year. Most infections will go away on their own and not cause serious problems. But thousands of women and men get cancer and other diseases from HPV.  2. HPV vaccine  HPV vaccine is approved by FDA and is recommended by CDC for both males and females. It is routinely given at 11 or 35 years of age, but it may be given beginning at age 9 years through age 26 years.  Most adolescents 9 through 35 years of age should get HPV vaccine as a two-dose series with the doses separated by 6-12 months. People who start HPV vaccination at 15 years of age and older should get the vaccine as a three-dose series with the second dose given 1-2 months after the first dose and the third dose given 6 months after the first dose. There are several exceptions to these age recommendations. Your health care provider can give you more information.  3. Some people should not get this vaccine   · Anyone who has had a severe (life-threatening) allergic reaction to a dose of HPV vaccine should not get another dose.  · Anyone who has a severe (life threatening) allergy to any component of HPV vaccine should not get the vaccine.  · Tell your doctor if you have any severe allergies that you know of, including a severe allergy to yeast.  · HPV vaccine is not recommended for pregnant women. If you learn that you were pregnant when you were vaccinated, there is no reason to expect any problems for you or your baby. Any woman who learns she was pregnant when she got HPV vaccine is encouraged to contact the manufacturer's registry for HPV vaccination during pregnancy at 1-800-986-8999. Women who are breastfeeding may be vaccinated.  · If you have a mild illness, such as a cold, you can probably get the vaccine today. If you are moderately or severely ill, you should probably wait until you recover. Your doctor can advise you.  4. Risks of a vaccine reaction  With any medicine, including vaccines, there is a chance of side effects. These are usually mild and go away on their own, but serious reactions are also possible.  Most people who get HPV vaccine do not have any serious problems with it.  Mild or moderate problems following HPV vaccine:  · Reactions in the arm where the shot was given:  ? Soreness (about 9 people in 10)  ? Redness or swelling (about 1 person   in 3)  · Fever:  ? Mild (100°F) (about 1 person in 10)  ? Moderate (102°F) (about 1 person in 65)  · Other problems:  ? Headache (about 1 person in 3)  Problems that could happen after any injected vaccine:  · People sometimes faint after a medical procedure, including vaccination. Sitting or lying down for about 15 minutes can help prevent fainting, and injuries caused by a fall. Tell your doctor if you feel dizzy, or have vision changes or ringing in the ears.  · Some people get severe pain in the shoulder and have difficulty moving  the arm where a shot was given. This happens very rarely.  · Any medication can cause a severe allergic reaction. Such reactions from a vaccine are very rare, estimated at about 1 in a million doses, and would happen within a few minutes to a few hours after the vaccination.  As with any medicine, there is a very remote chance of a vaccine causing a serious injury or death.  The safety of vaccines is always being monitored. For more information, visit: www.cdc.gov/vaccinesafety/.  5. What if there is a serious reaction?  What should I look for?  Look for anything that concerns you, such as signs of a severe allergic reaction, very high fever, or unusual behavior.  Signs of a severe allergic reaction can include hives, swelling of the face and throat, difficulty breathing, a fast heartbeat, dizziness, and weakness. These would usually start a few minutes to a few hours after the vaccination.  What should I do?  If you think it is a severe allergic reaction or other emergency that can't wait, call 9-1-1 or get to the nearest hospital. Otherwise, call your doctor.  Afterward, the reaction should be reported to the Vaccine Adverse Event Reporting System (VAERS). Your doctor should file this report, or you can do it yourself through the VAERS web site at www.vaers.hhs.gov, or by calling 1-800-822-7967.  VAERS does not give medical advice.  6. The National Vaccine Injury Compensation Program  The National Vaccine Injury Compensation Program (VICP) is a federal program that was created to compensate people who may have been injured by certain vaccines.  Persons who believe they may have been injured by a vaccine can learn about the program and about filing a claim by calling 1-800-338-2382 or visiting the VICP website at www.hrsa.gov/vaccinecompensation. There is a time limit to file a claim for compensation.  7. How can I learn more?  · Ask your health care provider. He or she can give you the vaccine  package insert or suggest other sources of information.  · Call your local or state health department.  · Contact the Centers for Disease Control and Prevention (CDC):  ? Call 1-800-232-4636 (1-800-CDC-INFO) or  ? Visit CDC’s website at www.cdc.gov/hpv  Vaccine Information Statement, HPV Vaccine (06/25/2015)  This information is not intended to replace advice given to you by your health care provider. Make sure you discuss any questions you have with your health care provider.  Document Released: 02/04/2014 Document Revised: 03/30/2016 Document Reviewed: 03/30/2016  Elsevier Interactive Patient Education © 2017 [employer] Inc.
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
## 2017-06-22 NOTE — Progress Notes (Signed)
LVM for pt to return office call for LAB results.

## 2017-06-28 NOTE — Progress Notes (Signed)
Pt aware of lab results & voiced understanding of those results.

## 2017-07-06 ENCOUNTER — Ambulatory Visit (INDEPENDENT_AMBULATORY_CARE_PROVIDER_SITE_OTHER): Payer: Medicare Other | Admitting: Internal Medicine

## 2017-07-06 VITALS — BP 130/88 | HR 76 | Temp 97.9°F | Resp 16 | Ht 67.75 in | Wt 166.4 lb

## 2017-07-06 DIAGNOSIS — F520 Hypoactive sexual desire disorder: Secondary | ICD-10-CM | POA: Diagnosis not present

## 2017-07-06 DIAGNOSIS — Z72811 Adult antisocial behavior: Secondary | ICD-10-CM

## 2017-07-06 DIAGNOSIS — IMO0002 Reserved for concepts with insufficient information to code with codable children: Secondary | ICD-10-CM

## 2017-07-07 ENCOUNTER — Encounter: Payer: Self-pay | Admitting: Internal Medicine

## 2017-07-07 DIAGNOSIS — F520 Hypoactive sexual desire disorder: Secondary | ICD-10-CM | POA: Insufficient documentation

## 2017-07-07 DIAGNOSIS — Z72811 Adult antisocial behavior: Secondary | ICD-10-CM | POA: Insufficient documentation

## 2017-07-07 DIAGNOSIS — IMO0002 Reserved for concepts with insufficient information to code with codable children: Secondary | ICD-10-CM | POA: Insufficient documentation
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
## 2017-10-22 ENCOUNTER — Other Ambulatory Visit: Payer: Self-pay | Admitting: Physician Assistant

## 2017-12-12 ENCOUNTER — Encounter: Payer: Self-pay | Admitting: Internal Medicine
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
## 2017-12-30 NOTE — Patient Instructions (Signed)

## 2017-12-31 ENCOUNTER — Encounter: Payer: Self-pay | Admitting: Internal Medicine

## 2017-12-31 ENCOUNTER — Ambulatory Visit (INDEPENDENT_AMBULATORY_CARE_PROVIDER_SITE_OTHER): Payer: Medicare Other | Admitting: Internal Medicine

## 2017-12-31 VITALS — BP 122/88 | HR 70 | Temp 97.5°F | Resp 18 | Ht 67.5 in | Wt 174.4 lb

## 2017-12-31 DIAGNOSIS — E782 Mixed hyperlipidemia: Secondary | ICD-10-CM | POA: Diagnosis not present

## 2017-12-31 DIAGNOSIS — R7303 Prediabetes: Secondary | ICD-10-CM

## 2017-12-31 DIAGNOSIS — Z79899 Other long term (current) drug therapy: Secondary | ICD-10-CM

## 2017-12-31 DIAGNOSIS — I10 Essential (primary) hypertension: Secondary | ICD-10-CM | POA: Diagnosis not present

## 2017-12-31 DIAGNOSIS — E559 Vitamin D deficiency, unspecified: Secondary | ICD-10-CM | POA: Diagnosis not present

## 2017-12-31 DIAGNOSIS — R7309 Other abnormal glucose: Secondary | ICD-10-CM | POA: Diagnosis not present

## 2017-12-31 DIAGNOSIS — Z8249 Family history of ischemic heart disease and other diseases of the circulatory system: Secondary | ICD-10-CM

## 2017-12-31 DIAGNOSIS — Z136 Encounter for screening for cardiovascular disorders: Secondary | ICD-10-CM

## 2017-12-31 DIAGNOSIS — S069X0S Unspecified intracranial injury without loss of consciousness, sequela: Secondary | ICD-10-CM

## 2017-12-31 DIAGNOSIS — R0989 Other specified symptoms and signs involving the circulatory and respiratory systems: Secondary | ICD-10-CM

## 2017-12-31 DIAGNOSIS — Z1211 Encounter for screening for malignant neoplasm of colon: Secondary | ICD-10-CM

## 2017-12-31 DIAGNOSIS — Z1212 Encounter for screening for malignant neoplasm of rectum: Secondary | ICD-10-CM

## 2018-01-01 LAB — CBC WITH DIFFERENTIAL/PLATELET
BASOS ABS: 82 {cells}/uL (ref 0–200)
Basophils Relative: 1.7 %
EOS PCT: 4.4 %
Eosinophils Absolute: 211 cells/uL (ref 15–500)
HEMATOCRIT: 49.3 % (ref 38.5–50.0)
HEMOGLOBIN: 16.7 g/dL (ref 13.2–17.1)
LYMPHS ABS: 1512 {cells}/uL (ref 850–3900)
MCH: 28.4 pg (ref 27.0–33.0)
MCHC: 33.9 g/dL (ref 32.0–36.0)
MCV: 84 fL (ref 80.0–100.0)
MONOS PCT: 8.8 %
MPV: 11 fL (ref 7.5–12.5)
Neutro Abs: 2573 cells/uL (ref 1500–7800)
Neutrophils Relative %: 53.6 %
Platelets: 211 10*3/uL (ref 140–400)
RBC: 5.87 10*6/uL — AB (ref 4.20–5.80)
RDW: 12.5 % (ref 11.0–15.0)
Total Lymphocyte: 31.5 %
WBC mixed population: 422 cells/uL (ref 200–950)
WBC: 4.8 10*3/uL (ref 3.8–10.8)

## 2018-01-01 LAB — COMPLETE METABOLIC PANEL WITH GFR
AG RATIO: 1.9 (calc) (ref 1.0–2.5)
ALT: 12 U/L (ref 9–46)
AST: 17 U/L (ref 10–40)
Albumin: 4.9 g/dL (ref 3.6–5.1)
Alkaline phosphatase (APISO): 59 U/L (ref 40–115)
BUN: 11 mg/dL (ref 7–25)
CALCIUM: 10 mg/dL (ref 8.6–10.3)
CO2: 33 mmol/L — AB (ref 20–32)
Chloride: 104 mmol/L (ref 98–110)
Creat: 0.99 mg/dL (ref 0.60–1.35)
GFR, EST NON AFRICAN AMERICAN: 98 mL/min/{1.73_m2} (ref >=60)
GFR, Est African American: 113 mL/min/{1.73_m2} (ref >=60)
Globulin: 2.6 g/dL (calc) (ref 1.9–3.7)
Glucose, Bld: 104 mg/dL — ABNORMAL HIGH (ref 65–99)
POTASSIUM: 4.5 mmol/L (ref 3.5–5.3)
Sodium: 141 mmol/L (ref 135–146)
Total Bilirubin: 0.4 mg/dL (ref 0.2–1.2)
Total Protein: 7.5 g/dL (ref 6.1–8.1)

## 2018-01-01 LAB — LIPID PANEL
CHOL/HDL RATIO: 3.8 (calc) (ref <5.0)
CHOLESTEROL: 195 mg/dL (ref <200)
HDL: 51 mg/dL (ref >40)
LDL Cholesterol (Calc): 127 mg/dL (calc) — ABNORMAL HIGH
Non-HDL Cholesterol (Calc): 144 mg/dL (calc) — ABNORMAL HIGH (ref <130)
Triglycerides: 77 mg/dL (ref <150)

## 2018-01-01 LAB — URINALYSIS, ROUTINE W REFLEX MICROSCOPIC
Bilirubin Urine: NEGATIVE
Glucose, UA: NEGATIVE
HGB URINE DIPSTICK: NEGATIVE
Ketones, ur: NEGATIVE
LEUKOCYTES UA: NEGATIVE
Nitrite: NEGATIVE
PROTEIN: NEGATIVE
Specific Gravity, Urine: 1.018 (ref 1.001–1.03)
pH: 7 (ref 5.0–8.0)

## 2018-01-01 LAB — HEMOGLOBIN A1C
EAG (MMOL/L): 5.5 (calc)
Hgb A1c MFr Bld: 5.1 % of total Hgb (ref <5.7)
Mean Plasma Glucose: 100 (calc)

## 2018-01-01 LAB — MICROALBUMIN / CREATININE URINE RATIO
Creatinine, Urine: 188 mg/dL (ref 20–320)
MICROALB UR: 0.2 mg/dL
MICROALB/CREAT RATIO: 1 ug/mg{creat} (ref <30)

## 2018-01-01 LAB — TSH: TSH: 2.15 m[IU]/L (ref 0.40–4.50)

## 2018-01-01 LAB — INSULIN, RANDOM: INSULIN: 6.3 u[IU]/mL (ref 2.0–19.6)

## 2018-01-01 LAB — VITAMIN D 25 HYDROXY (VIT D DEFICIENCY, FRACTURES): Vit D, 25-Hydroxy: 41 ng/mL (ref 30–100)

## 2018-01-01 LAB — MAGNESIUM: MAGNESIUM: 1.9 mg/dL (ref 1.5–2.5)

## 2018-01-23 ENCOUNTER — Other Ambulatory Visit: Payer: Self-pay | Admitting: Physician Assistant

## 2018-01-23 ENCOUNTER — Other Ambulatory Visit: Payer: Self-pay | Admitting: Internal Medicine

## 2018-02-20 ENCOUNTER — Ambulatory Visit (INDEPENDENT_AMBULATORY_CARE_PROVIDER_SITE_OTHER): Payer: Medicare Other | Admitting: Internal Medicine

## 2018-02-20 VITALS — BP 118/84 | HR 76 | Temp 97.7°F | Resp 16 | Ht 67.5 in | Wt 171.0 lb

## 2018-02-20 DIAGNOSIS — S20211A Contusion of right front wall of thorax, initial encounter: Secondary | ICD-10-CM

## 2018-02-20 MED ORDER — MELOXICAM 15 MG PO TABS: ORAL_TABLET | ORAL | 2 refills | Status: DC

## 2018-02-20 MED ORDER — GABAPENTIN 100 MG PO CAPS: ORAL_CAPSULE | ORAL | 2 refills | Status: DC

## 2018-02-24 ENCOUNTER — Encounter: Payer: Self-pay | Admitting: Internal Medicine
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
## 2018-07-03 ENCOUNTER — Ambulatory Visit: Payer: Self-pay | Admitting: Adult Health
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
## 2018-07-25 ENCOUNTER — Encounter: Payer: Self-pay | Admitting: Adult Health

## 2018-07-25 ENCOUNTER — Ambulatory Visit (INDEPENDENT_AMBULATORY_CARE_PROVIDER_SITE_OTHER): Payer: Medicare Other | Admitting: Adult Health

## 2018-07-25 VITALS — BP 124/82 | HR 82 | Temp 97.9°F | Ht 67.5 in | Wt 168.0 lb

## 2018-07-25 DIAGNOSIS — S069X0S Unspecified intracranial injury without loss of consciousness, sequela: Secondary | ICD-10-CM

## 2018-07-25 DIAGNOSIS — E559 Vitamin D deficiency, unspecified: Secondary | ICD-10-CM | POA: Diagnosis not present

## 2018-07-25 DIAGNOSIS — Z0001 Encounter for general adult medical examination with abnormal findings: Secondary | ICD-10-CM | POA: Diagnosis not present

## 2018-07-25 DIAGNOSIS — L709 Acne, unspecified: Secondary | ICD-10-CM

## 2018-07-25 DIAGNOSIS — R7309 Other abnormal glucose: Secondary | ICD-10-CM

## 2018-07-25 DIAGNOSIS — R6889 Other general symptoms and signs: Secondary | ICD-10-CM | POA: Diagnosis not present

## 2018-07-25 DIAGNOSIS — F1021 Alcohol dependence, in remission: Secondary | ICD-10-CM

## 2018-07-25 DIAGNOSIS — R569 Unspecified convulsions: Secondary | ICD-10-CM | POA: Diagnosis not present

## 2018-07-25 DIAGNOSIS — Z0000 Encounter for general adult medical examination without abnormal findings: Secondary | ICD-10-CM

## 2018-07-25 DIAGNOSIS — F520 Hypoactive sexual desire disorder: Secondary | ICD-10-CM | POA: Diagnosis not present

## 2018-07-25 DIAGNOSIS — F3341 Major depressive disorder, recurrent, in partial remission: Secondary | ICD-10-CM

## 2018-07-25 DIAGNOSIS — E782 Mixed hyperlipidemia: Secondary | ICD-10-CM

## 2018-07-25 DIAGNOSIS — I10 Essential (primary) hypertension: Secondary | ICD-10-CM

## 2018-07-25 DIAGNOSIS — Z79899 Other long term (current) drug therapy: Secondary | ICD-10-CM

## 2018-07-25 DIAGNOSIS — Z72811 Adult antisocial behavior: Secondary | ICD-10-CM

## 2018-07-25 DIAGNOSIS — IMO0002 Reserved for concepts with insufficient information to code with codable children: Secondary | ICD-10-CM

## 2018-07-25 NOTE — Patient Instructions (Addendum)
Troy [name] , Thank you for taking time to come for your Medicare Wellness Visit. I appreciate your ongoing commitment to your health goals. Please review the following plan we discussed and let me know if I can assist you in the future.   These are the goals we discussed: Goals    . DIET - INCREASE WATER INTAKE     Start with 1 bottle of water daily, and gradually increase for goal of 65+ fluid ounces, or 4-5 bottles daily     . Exercise 150 min/wk Moderate Activity    . Quit Smoking       This is a list of the screening recommended for you and due dates:  Health Maintenance  Topic Date Due  . HIV Screening  10/05/1996  . Flu Shot  02/21/2018  . Tetanus Vaccine  07/04/2022     Know what a healthy weight is for you (roughly BMI <25) and aim to maintain this  Aim for 7+ servings of fruits and vegetables daily  65-80+ fluid ounces of water or unsweet tea for healthy kidneys  Limit to max 1 drink of alcohol per day; avoid smoking/tobacco  Limit animal fats in diet for cholesterol and heart health - choose grass fed whenever available  Avoid highly processed foods, and foods high in saturated/trans fats  Aim for low stress - take time to unwind and care for your mental health  Aim for 150 min of moderate intensity exercise weekly for heart health, and weights twice weekly for bone health  Aim for 7-9 hours of sleep daily      Health Risks of Smoking Smoking cigarettes is very bad for your health. Tobacco smoke has over 200 known poisons in it. It contains the poisonous gases nitrogen oxide and carbon monoxide. There are over 60 chemicals in tobacco smoke that cause cancer. Smoking is difficult to quit because a chemical in tobacco, called nicotine, causes addiction or dependence. When you smoke and inhale, nicotine is absorbed rapidly into the bloodstream through your lungs. Both inhaled and non-inhaled nicotine may be addictive. What are the risks of cigarette  smoke? Cigarette smokers have an increased risk of many serious medical problems, including:  Lung cancer.  Lung disease, such as pneumonia, bronchitis, and emphysema.  Chest pain (angina) and heart attack because the heart is not getting enough oxygen.  Heart disease and peripheral blood vessel disease.  High blood pressure (hypertension).  Stroke.  Oral cancer, including cancer of the lip, mouth, or voice box.  Bladder cancer.  Pancreatic cancer.  Cervical cancer.  Pregnancy complications, including premature birth.  Stillbirths and smaller newborn babies, birth defects, and genetic damage to sperm.  Early menopause.  Lower estrogen level for women.  Infertility.  Facial wrinkles.  Blindness.  Increased risk of broken bones (fractures).  Senile dementia.  Stomach ulcers and internal bleeding.  Delayed wound healing and increased risk of complications during surgery.  Even smoking lightly shortens your life expectancy by several years. Because of secondhand smoke exposure, children of smokers have an increased risk of the following:  Sudden infant death syndrome (SIDS).  Respiratory infections.  Lung cancer.  Heart disease.  Ear infections. What are the benefits of quitting? There are many health benefits of quitting smoking. Here are some of them:  Within days of quitting smoking, your risk of having a heart attack decreases, your blood flow improves, and your lung capacity improves. Blood pressure, pulse rate, and breathing patterns start returning to normal soon  after quitting.  Within months, your lungs may clear up completely.  Quitting for 10 years reduces your risk of developing lung cancer and heart disease to almost that of a nonsmoker.  People who quit may see an improvement in their overall quality of life. How do I quit smoking?     Smoking is an addiction with both physical and psychological effects, and longtime habits can be hard  to change. Your health care provider can recommend:  Programs and community resources, which may include group support, education, or talk therapy.  Prescription medicines to help reduce cravings.  Nicotine replacement products, such as patches, gum, and nasal sprays. Use these products only as directed. Do not replace cigarette smoking with electronic cigarettes, which are commonly called e-cigarettes. The safety of e-cigarettes is not known, and some may contain harmful chemicals.  A combination of two or more of these methods. Where to find more information  American Lung Association: www.lung.org  American Cancer Society: www.cancer.org Summary  Smoking cigarettes is very bad for your health. Cigarette smokers have an increased risk of many serious medical problems, including several cancers, heart disease, and stroke.  Smoking is an addiction with both physical and psychological effects, and longtime habits can be hard to change.  By stopping right away, you can greatly reduce the risk of medical problems for you and your family.  To help you quit smoking, your health care provider can recommend programs, community resources, prescription medicines, and nicotine replacement products such as patches, gum, and nasal sprays. This information is not intended to replace advice given to you by your health care provider. Make sure you discuss any questions you have with your health care provider. Document Released: 08/17/2004 Document Revised: 10/11/2017 Document Reviewed: 07/14/2016 Elsevier Interactive Patient Education  2019 [employer].

## 2018-07-26 ENCOUNTER — Other Ambulatory Visit: Payer: Self-pay | Admitting: Adult Health

## 2018-07-26 LAB — CBC WITH DIFFERENTIAL/PLATELET
Absolute Monocytes: 409 cells/uL (ref 200–950)
BASOS PCT: 1.2 %
Basophils Absolute: 67 cells/uL (ref 0–200)
Eosinophils Absolute: 129 cells/uL (ref 15–500)
Eosinophils Relative: 2.3 %
HCT: 48.7 % (ref 38.5–50.0)
Hemoglobin: 16.6 g/dL (ref 13.2–17.1)
Lymphs Abs: 1758 cells/uL (ref 850–3900)
MCH: 28.8 pg (ref 27.0–33.0)
MCHC: 34.1 g/dL (ref 32.0–36.0)
MCV: 84.4 fL (ref 80.0–100.0)
MPV: 10.7 fL (ref 7.5–12.5)
Monocytes Relative: 7.3 %
NEUTROS PCT: 57.8 %
Neutro Abs: 3237 cells/uL (ref 1500–7800)
Platelets: 240 10*3/uL (ref 140–400)
RBC: 5.77 10*6/uL (ref 4.20–5.80)
RDW: 12.9 % (ref 11.0–15.0)
Total Lymphocyte: 31.4 %
WBC: 5.6 10*3/uL (ref 3.8–10.8)

## 2018-07-26 LAB — COMPLETE METABOLIC PANEL WITH GFR
AG Ratio: 1.7 (calc) (ref 1.0–2.5)
ALT: 11 U/L (ref 9–46)
AST: 17 U/L (ref 10–40)
Albumin: 4.8 g/dL (ref 3.6–5.1)
Alkaline phosphatase (APISO): 55 U/L (ref 40–115)
BUN: 8 mg/dL (ref 7–25)
CO2: 33 mmol/L — ABNORMAL HIGH (ref 20–32)
Calcium: 10.4 mg/dL — ABNORMAL HIGH (ref 8.6–10.3)
Chloride: 102 mmol/L (ref 98–110)
Creat: 1.05 mg/dL (ref 0.60–1.35)
GFR, Est African American: 105 mL/min/{1.73_m2} (ref >=60)
GFR, Est Non African American: 91 mL/min/{1.73_m2} (ref >=60)
Globulin: 2.9 g/dL (calc) (ref 1.9–3.7)
Glucose, Bld: 105 mg/dL — ABNORMAL HIGH (ref 65–99)
Potassium: 4.8 mmol/L (ref 3.5–5.3)
Sodium: 141 mmol/L (ref 135–146)
TOTAL PROTEIN: 7.7 g/dL (ref 6.1–8.1)
Total Bilirubin: 0.5 mg/dL (ref 0.2–1.2)

## 2018-07-26 LAB — LIPID PANEL
Cholesterol: 185 mg/dL (ref <200)
HDL: 52 mg/dL (ref >40)
LDL Cholesterol (Calc): 113 mg/dL (calc) — ABNORMAL HIGH
Non-HDL Cholesterol (Calc): 133 mg/dL (calc) — ABNORMAL HIGH (ref <130)
Total CHOL/HDL Ratio: 3.6 (calc) (ref <5.0)
Triglycerides: 92 mg/dL (ref <150)

## 2018-07-26 LAB — TSH: TSH: 2.68 mIU/L (ref 0.40–4.50)

## 2018-07-26 LAB — VITAMIN D 25 HYDROXY (VIT D DEFICIENCY, FRACTURES): VIT D 25 HYDROXY: 37 ng/mL (ref 30–100)

## 2018-07-26 MED ORDER — CHOLECALCIFEROL 50 MCG (2000 UT) PO CAPS: 2000.0000 [IU] | ORAL_CAPSULE | Freq: Every day | ORAL | Status: DC

## 2018-08-29 ENCOUNTER — Other Ambulatory Visit: Payer: Self-pay | Admitting: Internal Medicine

## 2018-09-09 ENCOUNTER — Other Ambulatory Visit: Payer: Self-pay | Admitting: Adult Health

## 2018-09-09 ENCOUNTER — Telehealth: Payer: Self-pay

## 2018-09-09 MED ORDER — OSELTAMIVIR PHOSPHATE 75 MG PO CAPS: 75.0000 mg | ORAL_CAPSULE | Freq: Every day | ORAL | 0 refills | Status: AC

## 2018-09-09 NOTE — Telephone Encounter (Signed)
Patient's girlfriend was just diagnosed with the Flu. Requesting Rx called in for himself

## 2018-09-10 NOTE — Telephone Encounter (Signed)
Voicemail not set up.

## 2018-11-11 ENCOUNTER — Encounter: Payer: Self-pay | Admitting: Internal Medicine

## 2018-11-11 DIAGNOSIS — R0989 Other specified symptoms and signs involving the circulatory and respiratory systems: Secondary | ICD-10-CM | POA: Insufficient documentation

## 2018-11-11 NOTE — Patient Instructions (Signed)
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
## 2018-11-12 ENCOUNTER — Other Ambulatory Visit: Payer: Self-pay

## 2018-11-12 ENCOUNTER — Other Ambulatory Visit: Payer: Self-pay | Admitting: *Deleted

## 2018-11-12 ENCOUNTER — Ambulatory Visit (INDEPENDENT_AMBULATORY_CARE_PROVIDER_SITE_OTHER): Payer: Medicare Other | Admitting: Internal Medicine

## 2018-11-12 VITALS — BP 122/74 | HR 64 | Temp 97.9°F | Resp 16 | Ht 67.5 in | Wt 174.2 lb

## 2018-11-12 DIAGNOSIS — E782 Mixed hyperlipidemia: Secondary | ICD-10-CM | POA: Diagnosis not present

## 2018-11-12 DIAGNOSIS — R0989 Other specified symptoms and signs involving the circulatory and respiratory systems: Secondary | ICD-10-CM | POA: Diagnosis not present

## 2018-11-12 DIAGNOSIS — B029 Zoster without complications: Secondary | ICD-10-CM

## 2018-11-12 DIAGNOSIS — Z79899 Other long term (current) drug therapy: Secondary | ICD-10-CM

## 2018-11-12 DIAGNOSIS — R7309 Other abnormal glucose: Secondary | ICD-10-CM | POA: Diagnosis not present

## 2018-11-12 DIAGNOSIS — L700 Acne vulgaris: Secondary | ICD-10-CM

## 2018-11-12 DIAGNOSIS — E559 Vitamin D deficiency, unspecified: Secondary | ICD-10-CM | POA: Diagnosis not present

## 2018-11-12 MED ORDER — DOXYCYCLINE HYCLATE 100 MG PO CAPS: ORAL_CAPSULE | ORAL | 0 refills | Status: DC

## 2018-11-13 LAB — CBC WITH DIFFERENTIAL/PLATELET
Absolute Monocytes: 340 cells/uL (ref 200–950)
Basophils Absolute: 52 cells/uL (ref 0–200)
Basophils Relative: 1.2 %
Eosinophils Absolute: 129 cells/uL (ref 15–500)
Eosinophils Relative: 3 %
HCT: 44.6 % (ref 38.5–50.0)
Hemoglobin: 15.1 g/dL (ref 13.2–17.1)
Lymphs Abs: 1445 cells/uL (ref 850–3900)
MCH: 29 pg (ref 27.0–33.0)
MCHC: 33.9 g/dL (ref 32.0–36.0)
MCV: 85.8 fL (ref 80.0–100.0)
MPV: 10.9 fL (ref 7.5–12.5)
Monocytes Relative: 7.9 %
Neutro Abs: 2335 cells/uL (ref 1500–7800)
Neutrophils Relative %: 54.3 %
Platelets: 210 10*3/uL (ref 140–400)
RBC: 5.2 10*6/uL (ref 4.20–5.80)
RDW: 13 % (ref 11.0–15.0)
Total Lymphocyte: 33.6 %
WBC: 4.3 10*3/uL (ref 3.8–10.8)

## 2018-11-13 LAB — COMPLETE METABOLIC PANEL WITH GFR
AG Ratio: 2 (calc) (ref 1.0–2.5)
ALT: 23 U/L (ref 9–46)
AST: 20 U/L (ref 10–40)
Albumin: 4.5 g/dL (ref 3.6–5.1)
Alkaline phosphatase (APISO): 51 U/L (ref 36–130)
BUN: 10 mg/dL (ref 7–25)
CO2: 33 mmol/L — ABNORMAL HIGH (ref 20–32)
Calcium: 9.4 mg/dL (ref 8.6–10.3)
Chloride: 102 mmol/L (ref 98–110)
Creat: 1.05 mg/dL (ref 0.60–1.35)
GFR, Est African American: 105 mL/min/{1.73_m2} (ref >=60)
GFR, Est Non African American: 90 mL/min/{1.73_m2} (ref >=60)
Globulin: 2.2 g/dL (calc) (ref 1.9–3.7)
Glucose, Bld: 92 mg/dL (ref 65–99)
Potassium: 4.3 mmol/L (ref 3.5–5.3)
Sodium: 140 mmol/L (ref 135–146)
Total Bilirubin: 0.3 mg/dL (ref 0.2–1.2)
Total Protein: 6.7 g/dL (ref 6.1–8.1)

## 2018-11-13 LAB — MAGNESIUM: Magnesium: 1.7 mg/dL (ref 1.5–2.5)

## 2018-11-13 LAB — INSULIN, RANDOM: Insulin: 5.8 u[IU]/mL

## 2018-11-13 LAB — LIPID PANEL
Cholesterol: 166 mg/dL (ref <200)
HDL: 57 mg/dL (ref >=40)
LDL Cholesterol (Calc): 95 mg/dL (calc)
Non-HDL Cholesterol (Calc): 109 mg/dL (calc) (ref <130)
Total CHOL/HDL Ratio: 2.9 (calc) (ref <5.0)
Triglycerides: 58 mg/dL (ref <150)

## 2018-11-13 LAB — HEMOGLOBIN A1C
Hgb A1c MFr Bld: 5.3 % of total Hgb (ref <5.7)
Mean Plasma Glucose: 105 (calc)
eAG (mmol/L): 5.8 (calc)

## 2018-11-13 LAB — VITAMIN D 25 HYDROXY (VIT D DEFICIENCY, FRACTURES): Vit D, 25-Hydroxy: 43 ng/mL (ref 30–100)

## 2018-11-13 LAB — TSH: TSH: 1.13 mIU/L (ref 0.40–4.50)

## 2018-11-15 LAB — VARICELLA ZOSTER ANTIBODY, IGG: Varicella IgG: 809.6 index

## 2018-11-15 LAB — VARICELLA ZOSTER ANTIBODY, IGM: Varicella Zoster Ab IgM: <=0.9 (ref <=0.90)

## 2019-01-27 ENCOUNTER — Encounter: Payer: Self-pay | Admitting: Internal Medicine

## 2019-02-17 ENCOUNTER — Other Ambulatory Visit: Payer: Self-pay | Admitting: Physician Assistant

## 2019-03-04 ENCOUNTER — Encounter: Payer: Self-pay | Admitting: Internal Medicine

## 2019-03-04 NOTE — Patient Instructions (Addendum)
Vit D  & Vit C 1,000 mg   are recommended to help protect  against the Covid-19 and other Corona viruses.    Also it's recommended  to take  Zinc 50 mg  to help  protect against the Covid-19   and best place to get  is also on [email or web address]  and don't pay more than 6-8 cents /pill !  ================================= Coronavirus (COVID-19) Are you at risk?  Are you at risk for the Coronavirus (COVID-19)?  To be considered HIGH RISK for Coronavirus (COVID-19), you have to meet the following criteria:  . Traveled to [place], [place], [place], [place] or [place]; or in the [place] to [place], [place], [place]  . or [place]; and have fever, cough, and shortness of breath within the last 2 weeks of travel OR . Been in close contact with a person diagnosed with COVID-19 within the last 2 weeks and have  . fever, cough,and shortness of breath .  . IF YOU DO NOT MEET THESE CRITERIA, YOU ARE CONSIDERED LOW RISK FOR COVID-19.  What to do if you are HIGH RISK for COVID-19?  Marland Kitchen If you are having a medical emergency, call 911. . Seek medical care right away. Before you go to a doctor's office, urgent care or emergency department, .  call ahead and tell them about your recent travel, contact with someone diagnosed with COVID-19  .  and your symptoms.  . You should receive instructions from your physician's office regarding next steps of care.  . When you arrive at healthcare provider, tell the healthcare staff immediately you have returned from  . visiting [place], [place], [place], [place] or [place]; or traveled in the [place] to [place], [place],  . [place] or [place] in the last two weeks or you have been in close contact with a person diagnosed with  . COVID-19 in the last 2 weeks.   . Tell the health care staff about your symptoms: fever, cough and shortness of breath. . After you have been seen by a medical provider, you will be either: o Tested for (COVID-19)  and discharged home on quarantine except to seek medical care if  o symptoms worsen, and asked to  - Stay home and avoid contact with others until you get your results (4-5 days)  - Avoid travel on public transportation if possible (such as bus, train, or airplane) or o Sent to the Emergency Department by EMS for evaluation, COVID-19 testing  and  o possible admission depending on your condition and test results.  What to do if you are LOW RISK for COVID-19?  Reduce your risk of any infection by using the same precautions used for avoiding the common cold or flu:  Marland Kitchen Wash your hands often with soap and warm water for at least 20 seconds.  If soap and water are not readily available,  . use an alcohol-based hand sanitizer with at least 60% alcohol.  . If coughing or sneezing, cover your mouth and nose by coughing or sneezing into the elbow areas of your shirt or coat, .  into a tissue or into your sleeve (not your hands). . Avoid shaking hands with others and consider head nods or verbal greetings only. . Avoid touching your eyes, nose, or mouth with unwashed hands.  . Avoid close contact with people who are sick. . Avoid places or events with large numbers of people in one location, like concerts or sporting events. Marland Kitchen  Carefully consider travel plans you have or are making. . If you are planning any travel outside or inside the [place], visit the CDC's Travelers' Health webpage for the latest health notices. . If you have some symptoms but not all symptoms, continue to monitor at home and seek medical attention  . if your symptoms worsen. . If you are having a medical emergency, call 911. >>>>>>>>>>>>>>>>>>>>>>>> Preventive Care for Adults  A healthy lifestyle and preventive care can promote health and wellness. Preventive health guidelines for men include the following key practices:  A routine yearly physical is a good way to check with your health care provider about your health and  preventative screening. It is a chance to share any concerns and updates on your health and to receive a thorough exam.  Visit your dentist for a routine exam and preventative care every 6 months. Brush your teeth twice a day and floss once a day. Good oral hygiene prevents tooth decay and gum disease.  The frequency of eye exams is based on your age, health, family medical history, use of contact lenses, and other factors. Follow your health care provider's recommendations for frequency of eye exams.  Eat a healthy diet. Foods such as vegetables, fruits, whole grains, low-fat dairy products, and lean protein foods contain the nutrients you need without too many calories. Decrease your intake of foods high in solid fats, added sugars, and salt. Eat the right amount of calories for you. Get information about a proper diet from your health care provider, if necessary.  Regular physical exercise is one of the most important things you can do for your health. Most adults should get at least 150 minutes of moderate-intensity exercise (any activity that increases your heart rate and causes you to sweat) each week. In addition, most adults need muscle-strengthening exercises on 2 or more days a week.  Maintain a healthy weight. The body mass index (BMI) is a screening tool to identify possible weight problems. It provides an estimate of body fat based on height and weight. Your health care provider can find your BMI and can help you achieve or maintain a healthy weight. For adults 20 years and older:  A BMI below 18.5 is considered underweight.  A BMI of 18.5 to 24.9 is normal.  A BMI of 25 to 29.9 is considered overweight.  A BMI of 30 and above is considered obese.  Maintain normal blood lipids and cholesterol levels by exercising and minimizing your intake of saturated fat. Eat a balanced diet with plenty of fruit and vegetables. Blood tests for lipids and cholesterol should begin at age 62 and be  repeated every 5 years. If your lipid or cholesterol levels are high, you are over 50, or you are at high risk for heart disease, you may need your cholesterol levels checked more frequently. Ongoing high lipid and cholesterol levels should be treated with medicines if diet and exercise are not working.  If you smoke, find out from your health care provider how to quit. If you do not use tobacco, do not start.  Lung cancer screening is recommended for adults aged 75-80 years who are at high risk for developing lung cancer because of a history of smoking. A yearly low-dose CT scan of the lungs is recommended for people who have at least a 30-pack-year history of smoking and are a current smoker or have quit within the past 15 years. A pack year of smoking is smoking an average of 1  pack of cigarettes a day for 1 year (for example: 1 pack a day for 30 years or 2 packs a day for 15 years). Yearly screening should continue until the smoker has stopped smoking for at least 15 years. Yearly screening should be stopped for people who develop a health problem that would prevent them from having lung cancer treatment.  If you choose to drink alcohol, do not have more than 2 drinks per day. One drink is considered to be 12 ounces (355 mL) of beer, 5 ounces (148 mL) of wine, or 1.5 ounces (44 mL) of liquor.  High blood pressure causes heart disease and increases the risk of stroke. Your blood pressure should be checked. Ongoing high blood pressure should be treated with medicines, if weight loss and exercise are not effective.  If you are 45-79 years old, ask your health care provider if you should take aspirin to prevent heart disease.  Diabetes screening involves taking a blood sample to check your fasting blood sugar level. Testing should be considered at a younger age or be carried out more frequently if you are overweight and have at least 1 risk factor for diabetes.  Colorectal cancer can be detected and  often prevented. Most routine colorectal cancer screening begins at the age of 50 and continues through age 75. However, your health care provider may recommend screening at an earlier age if you have risk factors for colon cancer. On a yearly basis, your health care provider may provide home test kits to check for hidden blood in the stool. Use of a small camera at the end of a tube to directly examine the colon (sigmoidoscopy or colonoscopy) can detect the earliest forms of colorectal cancer. Talk to your health care provider about this at age 50, when routine screening begins. Direct exam of the colon should be repeated every 5-10 years through age 75, unless early forms of precancerous polyps or small growths are found.  Screening for abdominal aortic aneurysm (AAA)  are recommended for persons over age 50 who have history of hypertensionor who are current or former smokers.  Talk with your health care provider about prostate cancer screening.  Testicular cancer screening is recommended for adult males. Screening includes self-exam, a health care provider exam, and other screening tests. Consult with your health care provider about any symptoms you have or any concerns you have about testicular cancer.  Use sunscreen. Apply sunscreen liberally and repeatedly throughout the day. You should seek shade when your shadow is shorter than you. Protect yourself by wearing long sleeves, pants, a wide-brimmed hat, and sunglasses year round, whenever you are outdoors.  Once a month, do a whole-body skin exam, using a mirror to look at the skin on your back. Tell your health care provider about new moles, moles that have irregular borders, moles that are larger than a pencil eraser, or moles that have changed in shape or color.  Stay current with required vaccines (immunizations).  Influenza vaccine. All adults should be immunized every year.  Tetanus, diphtheria, and acellular pertussis (Td, Tdap) vaccine.  An adult who has not previously received Tdap or who does not know his vaccine status should receive 1 dose of Tdap. This initial dose should be followed by tetanus and diphtheria toxoids (Td) booster doses every 10 years. Adults with an unknown or incomplete history of completing a 3-dose immunization series with Td-containing vaccines should begin or complete a primary immunization series including a Tdap dose. Adults should receive a   Td booster every 10 years.  Zoster vaccine. One dose is recommended for adults aged 69 years or older unless certain conditions are present.    Pneumococcal 13-valent conjugate (PCV13) vaccine. When indicated, a person who is uncertain of his immunization history and has no record of immunization should receive the PCV13 vaccine. An adult aged 11 years or older who has certain medical conditions and has not been previously immunized should receive 1 dose of PCV13 vaccine. This PCV13 should be followed with a dose of pneumococcal polysaccharide (PPSV23) vaccine. The PPSV23 vaccine dose should be obtained at least 8 weeks after the dose of PCV13 vaccine. An adult aged 41 years or older who has certain medical conditions and previously received 1 or more doses of PPSV23 vaccine should receive 1 dose of PCV13. The PCV13 vaccine dose should be obtained 1 or more years after the last PPSV23 vaccine dose.    Pneumococcal polysaccharide (PPSV23) vaccine. When PCV13 is also indicated, PCV13 should be obtained first. All adults aged 90 years and older should be immunized. An adult younger than age 67 years who has certain medical conditions should be immunized. Any person who resides in a nursing home or long-term care facility should be immunized. An adult smoker should be immunized. People with an immunocompromised condition and certain other conditions should receive both PCV13 and PPSV23 vaccines. People with human immunodeficiency virus (HIV) infection should be immunized as  soon as possible after diagnosis. Immunization during chemotherapy or radiation therapy should be avoided. Routine use of PPSV23 vaccine is not recommended for American Indians, [place] Natives, or people younger than 65 years unless there are medical conditions that require PPSV23 vaccine. When indicated, people who have unknown immunization and have no record of immunization should receive PPSV23 vaccine. One-time revaccination 5 years after the first dose of PPSV23 is recommended for people aged 19-64 years who have chronic kidney failure, nephrotic syndrome, asplenia, or immunocompromised conditions. People who received 1-2 doses of PPSV23 before age 57 years should receive another dose of PPSV23 vaccine at age 29 years or later if at least 5 years have passed since the previous dose. Doses of PPSV23 are not needed for people immunized with PPSV23 at or after age 48 years.  Hepatitis A vaccine. Adults who wish to be protected from this disease, have certain high-risk conditions, work with hepatitis A-infected animals, work in hepatitis A research labs, or travel to or work in countries with a high rate of hepatitis A should be immunized. Adults who were previously unvaccinated and who anticipate close contact with an international adoptee during the first 60 days after arrival in the [place] from a country with a high rate of hepatitis A should be immunized.  Hepatitis B vaccine. Adults should be immunized if they wish to be protected from this disease, have certain high-risk conditions, may be exposed to blood or other infectious body fluids, are household contacts or sex partners of hepatitis B positive people, are clients or workers in certain care facilities, or travel to or work in countries with a high rate of hepatitis B.  Preventive Service / Frequency  Ages 40 to 19  Blood pressure check.  Lipid and cholesterol check.  Hepatitis C blood test.** / For any individual with known risks  for hepatitis C.  Skin self-exam. / Monthly.  Influenza vaccine. / Every year.  Tetanus, diphtheria, and acellular pertussis (Tdap, Td) vaccine.** / Consult your health care provider. 1 dose of Td every 10 years.  HPV vaccine. / 3 doses over 6 months, if 43 or younger.  Measles, mumps, rubella (MMR) vaccine.** / You need at least 1 dose of MMR if you were born in 1957 or later. You may also need a second dose.  Pneumococcal 13-valent conjugate (PCV13) vaccine.** / Consult your health care provider.  Pneumococcal polysaccharide (PPSV23) vaccine.** / 1 to 2 doses if you smoke cigarettes or if you have certain conditions.  Meningococcal vaccine.** / 1 dose if you are age 35 to 96 years and a Market researcher living in a residence hall, or have one of several medical conditions. You may also need additional booster doses.  Hepatitis A vaccine.** / Consult your health care provider.  Hepatitis B vaccine.** / Consult your health care provider. +++++++++ Recommend Adult Low Dose Aspirin or  coated  Aspirin 81 mg daily  To reduce risk of Colon Cancer 40 %,  Skin Cancer 26 % ,  Melanoma 46%  and  Pancreatic cancer 60% ++++++++++++++++++ Vitamin D goal  is between 70-100.  Please make sure that you are taking your Vitamin D as directed.  It is very important as a natural anti-inflammatory  helping hair, skin, and nails, as well as reducing stroke and heart attack risk.  It helps your bones and helps with mood. It also decreases numerous cancer risks so please take it as directed.  Low Vit D is associated with a 200-300% higher risk for CANCER  and 200-300% higher risk for HEART   ATTACK  &  STROKE.   .....................................Marland Kitchen It is also associated with higher death rate at younger ages,  autoimmune diseases like Rheumatoid arthritis, Lupus, Multiple Sclerosis.    Also many other serious conditions, like depression, Alzheimer's Dementia, infertility, muscle  aches, fatigue, fibromyalgia - just to name a few. +++++++++++++++++++++ Recommend the book "The END of DIETING" by Dr Excell Seltzer  & the book "The END of DIABETES " by Dr Excell Seltzer At [email or web address].com - get book & Audio CD's    Being diabetic has a  300% increased risk for heart attack, stroke, cancer, and alzheimer- type vascular dementia. It is very important that you work harder with diet by avoiding all foods that are white. Avoid white rice (brown & wild rice is OK), white potatoes (sweetpotatoes in moderation is OK), White bread or wheat bread or anything made out of white flour like bagels, donuts, rolls, buns, biscuits, cakes, pastries, cookies, pizza crust, and pasta (made from white flour & egg whites) - vegetarian pasta or spinach or wheat pasta is OK. Multigrain breads like Arnold's or [employer], or multigrain sandwich thins or flatbreads.  Diet, exercise and weight loss can reverse and cure diabetes in the early stages.  Diet, exercise and weight loss is very important in the control and prevention of complications of diabetes which affects every system in your body, ie. Brain - dementia/stroke, eyes - glaucoma/blindness, heart - heart attack/heart failure, kidneys - dialysis, stomach - gastric paralysis, intestines - malabsorption, nerves - severe painful neuritis, circulation - gangrene & loss of a leg(s), and finally cancer and Alzheimers.    I recommend avoid fried & greasy foods,  sweets/candy, white rice (brown or wild rice or Quinoa is OK), white potatoes (sweet potatoes are OK) - anything made from white flour - bagels, doughnuts, rolls, buns, biscuits,white and wheat breads, pizza crust and traditional pasta made of white flour & egg white(vegetarian pasta or spinach or wheat pasta is OK).  Multi-grain bread is OK -  like multi-grain flat bread or sandwich thins. Avoid alcohol in excess. Exercise is also important.    Eat all the vegetables you want - avoid meat, especially red  meat and dairy - especially cheese.  Cheese is the most concentrated form of trans-fats which is the worst thing to clog up our arteries. Veggie cheese is OK which can be found in the fresh produce section at [employer] or [employer] or [employer]  +++++++++++++++++++ DASH Eating Plan  DASH stands for "Dietary Approaches to Stop Hypertension."   The DASH eating plan is a healthy eating plan that has been shown to reduce high blood pressure (hypertension). Additional health benefits may include reducing the risk of type 2 diabetes mellitus, heart disease, and stroke. The DASH eating plan may also help with weight loss. WHAT DO I NEED TO KNOW ABOUT THE DASH EATING PLAN? For the DASH eating plan, you will follow these general guidelines:  Choose foods with a percent daily value for sodium of less than 5% (as listed on the food label).  Use salt-free seasonings or herbs instead of table salt or sea salt.  Check with your health care provider or pharmacist before using salt substitutes.  Eat lower-sodium products, often labeled as "lower sodium" or "no salt added."  Eat fresh foods.  Eat more vegetables, fruits, and low-fat dairy products.  Choose whole grains. Look for the word "whole" as the first word in the ingredient list.  Choose fish   Limit sweets, desserts, sugars, and sugary drinks.  Choose heart-healthy fats.  Eat veggie cheese   Eat more home-cooked food and less restaurant, buffet, and fast food.  Limit fried foods.  Cook foods using methods other than frying.  Limit canned vegetables. If you do use them, rinse them well to decrease the sodium.  When eating at a restaurant, ask that your food be prepared with less salt, or no salt if possible.                      WHAT FOODS CAN I EAT? Read Dr Fara Olden Fuhrman's books on The End of Dieting & The End of Diabetes  Grains Whole grain or whole wheat bread. Brown rice. Whole grain or whole wheat pasta. Quinoa, bulgur,  and whole grain cereals. Low-sodium cereals. Corn or whole wheat flour tortillas. Whole grain cornbread. Whole grain crackers. Low-sodium crackers.  Vegetables Fresh or frozen vegetables (raw, steamed, roasted, or grilled). Low-sodium or reduced-sodium tomato and vegetable juices. Low-sodium or reduced-sodium tomato sauce and paste. Low-sodium or reduced-sodium canned vegetables.   Fruits All fresh, canned (in natural juice), or frozen fruits.  Protein Products  All fish and seafood.  Dried beans, peas, or lentils. Unsalted nuts and seeds. Unsalted canned beans.  Dairy Low-fat dairy products, such as skim or 1% milk, 2% or reduced-fat cheeses, low-fat ricotta or cottage cheese, or plain low-fat yogurt. Low-sodium or reduced-sodium cheeses.  Fats and Oils Tub margarines without trans fats. Light or reduced-fat mayonnaise and salad dressings (reduced sodium). Avocado. Safflower, olive, or canola oils. Natural peanut or almond butter.  Other Unsalted popcorn and pretzels. The items listed above may not be a complete list of recommended foods or beverages. Contact your dietitian for more options.  +++++++++++++++++++  WHAT FOODS ARE NOT RECOMMENDED? Grains/ White flour or wheat flour White bread. White pasta. White rice. Refined cornbread. Bagels and croissants. Crackers that contain trans fat.  Vegetables  Creamed or fried vegetables. Vegetables in a . Regular canned vegetables.  Regular canned tomato sauce and paste. Regular tomato and vegetable juices.  Fruits Dried fruits. Canned fruit in light or heavy syrup. Fruit juice.  Meat and Other Protein Products Meat in general - RED meat & White meat.  Fatty cuts of meat. Ribs, chicken wings, all processed meats as bacon, sausage, bologna, salami, fatback, hot dogs, bratwurst and packaged luncheon meats.  Dairy Whole or 2% milk, cream, half-and-half, and cream cheese. Whole-fat or sweetened yogurt. Full-fat cheeses or blue cheese.  Non-dairy creamers and whipped toppings. Processed cheese, cheese spreads, or cheese curds.  Condiments Onion and garlic salt, seasoned salt, table salt, and sea salt. Canned and packaged gravies. Worcestershire sauce. Tartar sauce. Barbecue sauce. Teriyaki sauce. Soy sauce, including reduced sodium. Steak sauce. Fish sauce. Oyster sauce. Cocktail sauce. Horseradish. Ketchup and mustard. Meat flavorings and tenderizers. Bouillon cubes. Hot sauce. Tabasco sauce. Marinades. Taco seasonings. Relishes.  Fats and Oils Butter, stick margarine, lard, shortening and bacon fat. Coconut, palm kernel, or palm oils. Regular salad dressings.  Pickles and olives. Salted popcorn and pretzels.  The items listed above may not be a complete list of foods and beverages to
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
## 2019-03-05 ENCOUNTER — Other Ambulatory Visit: Payer: Self-pay

## 2019-03-05 ENCOUNTER — Ambulatory Visit (INDEPENDENT_AMBULATORY_CARE_PROVIDER_SITE_OTHER): Payer: Medicare Other | Admitting: Internal Medicine

## 2019-03-05 VITALS — BP 122/82 | HR 72 | Temp 97.2°F | Resp 16 | Ht 67.5 in | Wt 167.4 lb

## 2019-03-05 DIAGNOSIS — R7309 Other abnormal glucose: Secondary | ICD-10-CM

## 2019-03-05 DIAGNOSIS — Z79899 Other long term (current) drug therapy: Secondary | ICD-10-CM

## 2019-03-05 DIAGNOSIS — E559 Vitamin D deficiency, unspecified: Secondary | ICD-10-CM

## 2019-03-05 DIAGNOSIS — R0989 Other specified symptoms and signs involving the circulatory and respiratory systems: Secondary | ICD-10-CM

## 2019-03-05 DIAGNOSIS — Z8249 Family history of ischemic heart disease and other diseases of the circulatory system: Secondary | ICD-10-CM | POA: Diagnosis not present

## 2019-03-05 DIAGNOSIS — R5383 Other fatigue: Secondary | ICD-10-CM

## 2019-03-05 DIAGNOSIS — R7303 Prediabetes: Secondary | ICD-10-CM

## 2019-03-05 DIAGNOSIS — Z1211 Encounter for screening for malignant neoplasm of colon: Secondary | ICD-10-CM

## 2019-03-05 DIAGNOSIS — Z136 Encounter for screening for cardiovascular disorders: Secondary | ICD-10-CM

## 2019-03-05 DIAGNOSIS — S069X0S Unspecified intracranial injury without loss of consciousness, sequela: Secondary | ICD-10-CM

## 2019-03-05 DIAGNOSIS — E782 Mixed hyperlipidemia: Secondary | ICD-10-CM | POA: Diagnosis not present

## 2019-03-06 LAB — URINALYSIS, ROUTINE W REFLEX MICROSCOPIC
Bacteria, UA: NONE SEEN /HPF
Bilirubin Urine: NEGATIVE
Glucose, UA: NEGATIVE
Hgb urine dipstick: NEGATIVE
Hyaline Cast: NONE SEEN /LPF
Ketones, ur: NEGATIVE
Leukocytes,Ua: NEGATIVE
Nitrite: NEGATIVE
RBC / HPF: NONE SEEN /HPF (ref 0–2)
Specific Gravity, Urine: 1.025 (ref 1.001–1.03)
Squamous Epithelial / HPF: NONE SEEN /HPF (ref <=5)
WBC, UA: NONE SEEN /HPF (ref 0–5)
pH: 7 (ref 5.0–8.0)

## 2019-03-06 LAB — INSULIN, RANDOM: Insulin: 17 u[IU]/mL

## 2019-03-06 LAB — CBC WITH DIFFERENTIAL/PLATELET
Absolute Monocytes: 320 cells/uL (ref 200–950)
Basophils Absolute: 72 cells/uL (ref 0–200)
Basophils Relative: 1.6 %
Eosinophils Absolute: 72 cells/uL (ref 15–500)
Eosinophils Relative: 1.6 %
HCT: 45 % (ref 38.5–50.0)
Hemoglobin: 15.1 g/dL (ref 13.2–17.1)
Lymphs Abs: 1242 cells/uL (ref 850–3900)
MCH: 28.7 pg (ref 27.0–33.0)
MCHC: 33.6 g/dL (ref 32.0–36.0)
MCV: 85.6 fL (ref 80.0–100.0)
MPV: 10.8 fL (ref 7.5–12.5)
Monocytes Relative: 7.1 %
Neutro Abs: 2795 cells/uL (ref 1500–7800)
Neutrophils Relative %: 62.1 %
Platelets: 191 10*3/uL (ref 140–400)
RBC: 5.26 10*6/uL (ref 4.20–5.80)
RDW: 12.9 % (ref 11.0–15.0)
Total Lymphocyte: 27.6 %
WBC: 4.5 10*3/uL (ref 3.8–10.8)

## 2019-03-06 LAB — LIPID PANEL
Cholesterol: 169 mg/dL (ref <200)
HDL: 49 mg/dL (ref >=40)
LDL Cholesterol (Calc): 100 mg/dL (calc) — ABNORMAL HIGH
Non-HDL Cholesterol (Calc): 120 mg/dL (calc) (ref <130)
Total CHOL/HDL Ratio: 3.4 (calc) (ref <5.0)
Triglycerides: 108 mg/dL (ref <150)

## 2019-03-06 LAB — COMPLETE METABOLIC PANEL WITH GFR
AG Ratio: 2.3 (calc) (ref 1.0–2.5)
ALT: 8 U/L — ABNORMAL LOW (ref 9–46)
AST: 11 U/L (ref 10–40)
Albumin: 4.6 g/dL (ref 3.6–5.1)
Alkaline phosphatase (APISO): 36 U/L (ref 36–130)
BUN: 9 mg/dL (ref 7–25)
CO2: 28 mmol/L (ref 20–32)
Calcium: 9.7 mg/dL (ref 8.6–10.3)
Chloride: 105 mmol/L (ref 98–110)
Creat: 0.96 mg/dL (ref 0.60–1.35)
GFR, Est African American: 117 mL/min/{1.73_m2} (ref >=60)
GFR, Est Non African American: 101 mL/min/{1.73_m2} (ref >=60)
Globulin: 2 g/dL (calc) (ref 1.9–3.7)
Glucose, Bld: 91 mg/dL (ref 65–99)
Potassium: 4.3 mmol/L (ref 3.5–5.3)
Sodium: 141 mmol/L (ref 135–146)
Total Bilirubin: 0.4 mg/dL (ref 0.2–1.2)
Total Protein: 6.6 g/dL (ref 6.1–8.1)

## 2019-03-06 LAB — MICROALBUMIN / CREATININE URINE RATIO
Creatinine, Urine: 156 mg/dL (ref 20–320)
Microalb Creat Ratio: 3 mcg/mg creat (ref <30)
Microalb, Ur: 0.5 mg/dL

## 2019-03-06 LAB — TSH: TSH: 1.66 mIU/L (ref 0.40–4.50)

## 2019-03-06 LAB — TESTOSTERONE: Testosterone: 321 ng/dL (ref 250–827)

## 2019-03-06 LAB — HEMOGLOBIN A1C
Hgb A1c MFr Bld: 5.3 % of total Hgb (ref <5.7)
Mean Plasma Glucose: 105 (calc)
eAG (mmol/L): 5.8 (calc)

## 2019-03-06 LAB — VITAMIN D 25 HYDROXY (VIT D DEFICIENCY, FRACTURES): Vit D, 25-Hydroxy: 51 ng/mL (ref 30–100)

## 2019-03-06 LAB — MAGNESIUM: Magnesium: 1.5 mg/dL (ref 1.5–2.5)

## 2019-03-08 ENCOUNTER — Encounter: Payer: Self-pay | Admitting: Internal Medicine

## 2019-06-04 ENCOUNTER — Other Ambulatory Visit: Payer: Self-pay

## 2019-06-04 ENCOUNTER — Ambulatory Visit (INDEPENDENT_AMBULATORY_CARE_PROVIDER_SITE_OTHER): Payer: Medicare Other | Admitting: Adult Health

## 2019-06-04 ENCOUNTER — Encounter: Payer: Self-pay | Admitting: Adult Health

## 2019-06-04 VITALS — BP 116/82 | HR 69 | Temp 97.5°F | Wt 159.0 lb

## 2019-06-04 DIAGNOSIS — A630 Anogenital (venereal) warts: Secondary | ICD-10-CM | POA: Diagnosis not present

## 2019-06-04 DIAGNOSIS — B079 Viral wart, unspecified: Secondary | ICD-10-CM | POA: Diagnosis not present
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
## 2019-06-04 NOTE — Patient Instructions (Signed)
Genital Warts Genital warts are a common STD (sexually transmitted disease). They may appear as small bumps on the skin of the genital and anal areas. They sometimes become irritated and cause pain. Genital warts are easily passed to other people through sexual contact. Many people do not know that they are infected. They may be infected for years without symptoms. However, even if they do not have symptoms, they can pass the infection to their sexual partners. Getting treatment is important because genital warts can lead to other problems. In females, the virus that causes genital warts may increase the risk for cervical cancer. What are the causes? This condition is caused by a virus that is called human papillomavirus (HPV). HPV is spread by having unprotected sex with an infected person. It can be spread through vaginal, anal, and oral sex. What increases the risk? You are more likely to develop this condition if:  You have unprotected sex.  You have multiple sexual partners.  You are sexually active before age 16.  You are a man who isnot circumcised.  You have a male sexual partner who is not circumcised.  You have a weakened body defense system (immune system) due to disease or medicine.  You smoke. What are the signs or symptoms? Symptoms of this condition include:  Small growths in the genital area or anal area. These warts often grow in clusters.  Itching and irritation in the genital area or anal area.  Bleeding from the warts.  Pain during sex. How is this diagnosed? This condition is diagnosed based on:  Your symptoms.  A physical exam. You may also have other tests, including:  Biopsy. A tissue sample is removed so it can be checked under a microscope.  Colposcopy. In females, a magnifying tool is used to examine the vagina and cervix. Certain solutions may be used to make the HPV cells change color so they can be seen more easily.  A Pap test in females.   Tests for other STDs. How is this treated? This condition may be treated with:  Medicines, such as: ? Solutions or creams applied to your skin (topical).  Procedures, such as: ? Freezing the warts with liquid nitrogen (cryotherapy). ? Burning the warts with a laser or electric probe (electrocautery). ? Surgery to remove the warts. Follow these instructions at home: Medicines   Apply over-the-counter and prescription medicines only as told by your health care provider.  Do not treat genital warts with medicines that are used for treating hand warts.  Talk with your health care provider about using over-the-counter anti-itch creams. Instructions for women  Get screened regularly for cervical cancer. Women who have genital warts are at an increased risk for this cancer. This type of cancer is slow growing and can be cured if it is found early.  If you become pregnant, tell your health care provider that you have had an HPV infection. Your health care provider will monitor you closely during pregnancy to be sure that your baby is safe. General instructions  Do not touch or scratch the warts.  Do not have sex until your treatment has been completed.  Tell your current and past sexual partners about your condition because they may also need treatment.  After treatment, use condoms during sex to prevent future infections.  Keep all follow-up visits as told by your health care provider. This is important. How is this prevented? Talk with your health care provider about getting the HPV vaccine. The vaccine:    Can prevent some HPV infections and cancers.  Is recommended for males and females who are 11-26 years old.  Is not recommended for pregnant women.  Will not work if you already have HPV. Contact a health care provider if you:  Have redness, swelling, or pain in the area of the treated skin.  Have a fever.  Feel generally ill.  Feel lumps in and around your genital or  anal area.  Have bleeding in your genital or anal area.  Have pain during sex. Summary  Genital warts are a common STD (sexually transmitted disease). It may appear as small bumps on the genital and anal areas.  This condition is caused by a virus that is called human papillomavirus (HPV). HPV is spread by having unprotected sex with an infected person. It can be spread through vaginal, anal, and oral sex.  Treatment is important because genital warts can lead to other problems. In females, the virus that causes genital warts may increase the risk for cervical cancer.  This condition may be treated with medicine that is applied to the skin, or procedures to remove the warts.  The HPV vaccine can prevent some HPV infections and cancers. It is recommended that the vaccine be given to males and females who are 11-26 years old. This information is not intended to replace advice given to you by your health care provider. Make sure you discuss any questions you have with your health care provider. Document Released: 07/07/2000 Document Revised: 08/14/2017 Document Reviewed: 08/14/2017 Elsevier Patient Education  2020 Elsevier Inc[employer]  

## 2019-06-10 ENCOUNTER — Ambulatory Visit: Payer: Medicare Other | Admitting: Adult Health

## 2019-06-16 ENCOUNTER — Encounter: Payer: Self-pay | Admitting: Adult Health

## 2019-06-16 DIAGNOSIS — A630 Anogenital (venereal) warts: Secondary | ICD-10-CM

## 2019-06-16 HISTORY — DX: Anogenital (venereal) warts: A63.0
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
## 2019-06-17 ENCOUNTER — Ambulatory Visit: Payer: Medicare Other | Admitting: Adult Health

## 2019-06-30 ENCOUNTER — Other Ambulatory Visit: Payer: Self-pay

## 2019-06-30 DIAGNOSIS — Z20822 Contact with and (suspected) exposure to covid-19: Secondary | ICD-10-CM

## 2019-07-03 LAB — NOVEL CORONAVIRUS, NAA
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
## 2019-08-04 ENCOUNTER — Encounter: Payer: Self-pay | Admitting: Adult Health

## 2019-08-04 ENCOUNTER — Other Ambulatory Visit: Payer: Self-pay

## 2019-08-04 ENCOUNTER — Ambulatory Visit (INDEPENDENT_AMBULATORY_CARE_PROVIDER_SITE_OTHER): Payer: Medicare Other | Admitting: Adult Health

## 2019-08-04 VITALS — BP 124/80 | HR 84 | Temp 98.0°F | Ht 67.5 in | Wt 166.8 lb

## 2019-08-04 DIAGNOSIS — R6889 Other general symptoms and signs: Secondary | ICD-10-CM

## 2019-08-04 DIAGNOSIS — E782 Mixed hyperlipidemia: Secondary | ICD-10-CM

## 2019-08-04 DIAGNOSIS — Z0001 Encounter for general adult medical examination with abnormal findings: Secondary | ICD-10-CM | POA: Diagnosis not present

## 2019-08-04 DIAGNOSIS — L700 Acne vulgaris: Secondary | ICD-10-CM

## 2019-08-04 DIAGNOSIS — F3341 Major depressive disorder, recurrent, in partial remission: Secondary | ICD-10-CM | POA: Diagnosis not present

## 2019-08-04 DIAGNOSIS — I10 Essential (primary) hypertension: Secondary | ICD-10-CM

## 2019-08-04 DIAGNOSIS — R569 Unspecified convulsions: Secondary | ICD-10-CM | POA: Diagnosis not present

## 2019-08-04 DIAGNOSIS — Z72811 Adult antisocial behavior: Secondary | ICD-10-CM

## 2019-08-04 DIAGNOSIS — IMO0002 Reserved for concepts with insufficient information to code with codable children: Secondary | ICD-10-CM

## 2019-08-04 DIAGNOSIS — F520 Hypoactive sexual desire disorder: Secondary | ICD-10-CM | POA: Diagnosis not present

## 2019-08-04 DIAGNOSIS — S069X0S Unspecified intracranial injury without loss of consciousness, sequela: Secondary | ICD-10-CM

## 2019-08-04 DIAGNOSIS — R7309 Other abnormal glucose: Secondary | ICD-10-CM

## 2019-08-04 DIAGNOSIS — Z79899 Other long term (current) drug therapy: Secondary | ICD-10-CM

## 2019-08-04 DIAGNOSIS — F1021 Alcohol dependence, in remission: Secondary | ICD-10-CM

## 2019-08-04 DIAGNOSIS — Z0000 Encounter for general adult medical examination without abnormal findings: Secondary | ICD-10-CM

## 2019-08-04 DIAGNOSIS — Z6825 Body mass index (BMI) 25.0-25.9, adult: Secondary | ICD-10-CM

## 2019-08-04 DIAGNOSIS — E559 Vitamin D deficiency, unspecified: Secondary | ICD-10-CM

## 2019-08-04 NOTE — Patient Instructions (Addendum)
Mr. Emberton , Thank you for taking time to come for your Medicare Wellness Visit. I appreciate your ongoing commitment to your health goals. Please review the following plan we discussed and let me know if I can assist you in the future.   These are the goals we discussed: Goals    . DIET - INCREASE WATER INTAKE     Start with 1 bottle of water daily, and gradually increase for goal of 65+ fluid ounces, or 4-5 bottles daily     . Exercise 150 min/wk Moderate Activity    . Quit Smoking       This is a list of the screening recommended for you and due dates:  Health Maintenance  Topic Date Due  . HIV Screening  10/05/1996  . Tetanus Vaccine  07/04/2022  . Flu Shot  Completed     Have an accountability partner who calls to check on you  STOP buying marijuana; avoid going near where you would normally buy it  Come up with a list of activities to do to keep you busy - exercise, new instrument, anything that keeps your hands busy usually helps  Log how much money you are saving     SMOKING CESSATION  American cancer society  38453646803 for more information or for a free program for smoking cessation help.   You can call QUIT SMART 1-800-QUIT-NOW for free nicotine patches or replacement therapy- if they are out- keep calling  [facility] Can call for smoking cessation classes, 978-021-8914  If you have a smart phone, please look up Smoke Free app, this will help you stay on track and give you information about money you have saved, life that you have gained back and a ton of more information.     ADVANTAGES OF QUITTING SMOKING  Within 20 minutes, blood pressure decreases. Your pulse is at normal level.  After 8 hours, carbon monoxide levels in the blood return to normal. Your oxygen level increases.  After 24 hours, the chance of having a heart attack starts to decrease. Your breath, hair, and body stop smelling like smoke.  After 48 hours, damaged  nerve endings begin to recover. Your sense of taste and smell improve.  After 72 hours, the body is virtually free of nicotine. Your bronchial tubes relax and breathing becomes easier.  After 2 to 12 weeks, lungs can hold more air. Exercise becomes easier and circulation improves.  After 1 year, the risk of coronary heart disease is cut in half.  After 5 years, the risk of stroke falls to the same as a nonsmoker.  After 10 years, the risk of lung cancer is cut in half and the risk of other cancers decreases significantly.  After 15 years, the risk of coronary heart disease drops, usually to the level of a nonsmoker.  You will have extra money to spend on things other than cigarettes.       What You Need to Know About Marijuana Use Marijuana is a mixture of the dried leaves and flowers of the hemp plant Cannabis sativa. The plant's active ingredients (cannabinoids) change the chemistry of the brain. If you smoke or eat marijuana, you will experience changes in the way you think, feel, and behave. Many people use marijuana because it helps them relax and puts them in a pleasurable mood (marijuana high). Some people use marijuana for medical effects, such as:  Reduced nausea.  Increased appetite.  Reduced muscle spasm.  Pain relief.  Researchers are studying other possible medical uses for marijuana. How can marijuana use affect me? Many people find a marijuana high to be pleasurable and relaxing. Other people find a marijuana high to be uncomfortable or anxiety-causing. This drug can cause short-term and long-term physical and mental effects. Taking high doses of marijuana or trying to quit marijuana can also affect you. Short-term effects of marijuana use include:  Temporary relief of symptoms from a medical condition.  Changes in mood and perception (feeling high).  Increased hunger.  Increased heart rate.  Slowed movement and reaction time.  Poor memory, judgment, and  problem solving ability.  Altered sense of time.  Changes to vision.  Bloodshot eyes.  Coughing. Long-term effects of marijuana use include:  Higher risk of lung and breathing problems.  Higher risk of heart attack.  Higher risk of testicular cancer.  Mental and physical dependence (addiction).  Slowed brain development in young people. Babies whose mothers used marijuana during pregnancy have an increased risk of problems with brain development and behavior.  Temporary periods of false perceptions or beliefs (hallucinations or paranoia).  Worsening of mental illness.  Onset of new mental illness such as anxiety, depression, or suicidal thoughts.  Withdrawal symptoms when stopping marijuana, such as sleeplessness, anxiety, cravings, and anger.  Difficulty maintaining healthy relationships.  Poor memory, and difficulty concentrating and learning. This can result in decreased intelligence and poor performance at school or work, and an increased risk of dropping out of school.  Higher risk of using other substances like alcohol and nicotine. High doses of marijuana can cause:  Panic.  Anxiety.  Mental confusion.  Hallucinations. Quitting marijuana after using it for a long time can cause withdrawal symptoms, such as:  Headache.  Shakiness.  Sweating.  Stomach pain.  Nausea.  Restlessness.  Irritability.  Trouble sleeping.  Decreased appetite. What are the benefits of not using marijuana? Not using marijuana can keep you from becoming dependent on it. You can avoid the negative effects of the drug that can reduce your quality of life. You can avoid accidents caused by the slowed reaction time that is common with marijuana use. If I already use marijuana, what steps can I take to stop using it? If you are not physically or mentally dependent on marijuana, you should be able to stop using it on your own. If you cannot stop on your own, ask your health care  provider for help. Treatment for marijuana addiction is similar to treatment for other addictions. It may include:  Cognitive-behavioral therapy (psychotherapy). This may include individual or group therapy.  Joining a support group.  Treating medical, behavioral, or mental health conditions that exist along with marijuana dependency.  Where can I get more information? Learn more about:  Marijuana from the [place] on Drug Abuse: MissingBag.si  Medical marijuana from the [place]: LocatorExpress.is  Treatment options from the Substance Abuse and [place]: https://findtreatment.EcoRefrigerator.com.au  Recovery from marijuana dependency from Recovery.org: http://www.recovery.org/topics/marijuana-recovery When should I seek medical care? Talk with your health care provider if:  You want to stop using marijuana but you cannot.  You have withdrawal symptoms when you try to stop using marijuana.  You are using marijuana every day.  You are using marijuana along with other drugs like cocaine or alcohol.  You have anxiety or depression.  You have hallucinations or paranoia.  Marijuana use is interfering with your relationships or your ability to function normally at school or at work.  Summary  You may become physically or mentally dependent on marijuana.  Long-term use may interfere with your ability to function normally at home, school, or work.  Marijuana addiction is treatable. This information is not intended to replace advice given to you by your health care provider. Make sure you discuss any questions you have with your health care provider. Document Revised: 06/22/2017 Document Reviewed: 03/29/2016 Elsevier Patient Education  2020 [employer].

## 2019-08-05 LAB — MAGNESIUM: Magnesium: 1.7 mg/dL (ref 1.5–2.5)

## 2019-08-05 LAB — CBC WITH DIFFERENTIAL/PLATELET
Absolute Monocytes: 390 cells/uL (ref 200–950)
Basophils Absolute: 90 cells/uL (ref 0–200)
Basophils Relative: 1.5 %
Eosinophils Absolute: 138 cells/uL (ref 15–500)
Eosinophils Relative: 2.3 %
HCT: 44.6 % (ref 38.5–50.0)
Hemoglobin: 15 g/dL (ref 13.2–17.1)
Lymphs Abs: 1908 cells/uL (ref 850–3900)
MCH: 28.6 pg (ref 27.0–33.0)
MCHC: 33.6 g/dL (ref 32.0–36.0)
MCV: 85 fL (ref 80.0–100.0)
MPV: 11.1 fL (ref 7.5–12.5)
Monocytes Relative: 6.5 %
Neutro Abs: 3474 cells/uL (ref 1500–7800)
Neutrophils Relative %: 57.9 %
Platelets: 208 10*3/uL (ref 140–400)
RBC: 5.25 10*6/uL (ref 4.20–5.80)
RDW: 12.8 % (ref 11.0–15.0)
Total Lymphocyte: 31.8 %
WBC: 6 10*3/uL (ref 3.8–10.8)

## 2019-08-05 LAB — LIPID PANEL
Cholesterol: 172 mg/dL
HDL: 47 mg/dL
LDL Cholesterol (Calc): 105 mg/dL — ABNORMAL HIGH
Non-HDL Cholesterol (Calc): 125 mg/dL
Total CHOL/HDL Ratio: 3.7 (calc)
Triglycerides: 103 mg/dL

## 2019-08-05 LAB — COMPLETE METABOLIC PANEL WITH GFR
AG Ratio: 1.9 (calc) (ref 1.0–2.5)
ALT: 7 U/L — ABNORMAL LOW (ref 9–46)
AST: 13 U/L (ref 10–40)
Albumin: 4.6 g/dL (ref 3.6–5.1)
Alkaline phosphatase (APISO): 40 U/L (ref 36–130)
BUN: 9 mg/dL (ref 7–25)
CO2: 30 mmol/L (ref 20–32)
Calcium: 9.5 mg/dL (ref 8.6–10.3)
Chloride: 103 mmol/L (ref 98–110)
Creat: 0.95 mg/dL (ref 0.60–1.35)
GFR, Est African American: 118 mL/min/{1.73_m2} (ref >=60)
GFR, Est Non African American: 102 mL/min/{1.73_m2} (ref >=60)
Globulin: 2.4 g/dL (calc) (ref 1.9–3.7)
Glucose, Bld: 112 mg/dL — ABNORMAL HIGH (ref 65–99)
Potassium: 4 mmol/L (ref 3.5–5.3)
Sodium: 141 mmol/L (ref 135–146)
Total Bilirubin: 0.3 mg/dL (ref 0.2–1.2)
Total Protein: 7 g/dL (ref 6.1–8.1)

## 2019-08-05 LAB — TSH: TSH: 1.68 mIU/L (ref 0.40–4.50)

## 2019-08-20 ENCOUNTER — Other Ambulatory Visit: Payer: Self-pay | Admitting: Internal Medicine

## 2019-08-20 MED ORDER — TRAZODONE HCL 150 MG PO TABS: ORAL_TABLET | ORAL | 0 refills | Status: DC

## 2019-11-18 ENCOUNTER — Other Ambulatory Visit: Payer: Self-pay | Admitting: Internal Medicine

## 2019-11-18 MED ORDER — TRAZODONE HCL 150 MG PO TABS: ORAL_TABLET | ORAL | 0 refills | Status: DC

## 2019-11-27 ENCOUNTER — Ambulatory Visit: Payer: Medicare Other | Attending: Internal Medicine

## 2020-03-02 ENCOUNTER — Other Ambulatory Visit: Payer: Self-pay | Admitting: Internal Medicine

## 2020-03-02 MED ORDER — TRAZODONE HCL 150 MG PO TABS: ORAL_TABLET | ORAL | 0 refills | Status: DC

## 2020-04-19 ENCOUNTER — Encounter: Payer: Self-pay | Admitting: Internal Medicine
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
## 2020-04-19 NOTE — Patient Instructions (Signed)
Due to recent changes in healthcare laws, you may see the results of your imaging and laboratory studies on MyChart before your provider has had a chance to review them.  We understand that in some cases there may be results that are confusing or concerning to you. Not all laboratory results come back in the same time frame and the provider may be waiting for multiple results in order to interpret others.  Please give [place] 48 hours in order for your provider to thoroughly review all the results before contacting the office for clarification of your results.   +++++++++++++++++++++++++++++++++  Vit D  & Vit C 1,000 mg   are recommended to help protect  against the Covid-19 and other Corona viruses.    Also it's recommended  to take  Zinc 50 mg  to help  protect against the Covid-19   and best place to get  is also on [email or web address]  and don't pay more than 6-8 cents /pill !  ================================= Coronavirus (COVID-19) Are you at risk?  Are you at risk for the Coronavirus (COVID-19)?  To be considered HIGH RISK for Coronavirus (COVID-19), you have to meet the following criteria:  . Traveled to [place], [place], [place], [place] or [place]; or in the [place] to [place], [place], [place]  . or [place]; and have fever, cough, and shortness of breath within the last 2 weeks of travel OR . Been in close contact with a person diagnosed with COVID-19 within the last 2 weeks and have  . fever, cough,and shortness of breath .  . IF YOU DO NOT MEET THESE CRITERIA, YOU ARE CONSIDERED LOW RISK FOR COVID-19.  What to do if you are HIGH RISK for COVID-19?  Marland Kitchen If you are having a medical emergency, call 911. . Seek medical care right away. Before you go to a doctor's office, urgent care or emergency department, .  call ahead and tell them about your recent travel, contact with someone diagnosed with COVID-19  .  and your symptoms.  . You should receive instructions from your  physician's office regarding next steps of care.  . When you arrive at healthcare provider, tell the healthcare staff immediately you have returned from  . visiting [place], [place], [place], [place] or [place]; or traveled in the [place] to [place], [place],  . [place] or [place] in the last two weeks or you have been in close contact with a person diagnosed with  . COVID-19 in the last 2 weeks.   . Tell the health care staff about your symptoms: fever, cough and shortness of breath. . After you have been seen by a medical provider, you will be either: o Tested for (COVID-19) and discharged home on quarantine except to seek medical care if  o symptoms worsen, and asked to  - Stay home and avoid contact with others until you get your results (4-5 days)  - Avoid travel on public transportation if possible (such as bus, train, or airplane) or o Sent to the Emergency Department by EMS for evaluation, COVID-19 testing  and  o possible admission depending on your condition and test results.  What to do if you are LOW RISK for COVID-19?  Reduce your risk of any infection by using the same precautions used for avoiding the common cold or flu:  Marland Kitchen Wash your hands often with soap and warm water for at least 20 seconds.  If soap and water are not readily  available,  . use an alcohol-based hand sanitizer with at least 60% alcohol.  . If coughing or sneezing, cover your mouth and nose by coughing or sneezing into the elbow areas of your shirt or coat, .  into a tissue or into your sleeve (not your hands). . Avoid shaking hands with others and consider head nods or verbal greetings only. . Avoid touching your eyes, nose, or mouth with unwashed hands.  . Avoid close contact with people who are sick. . Avoid places or events with large numbers of people in one location, like concerts or sporting events. . Carefully consider travel plans you have or are making. . If you are planning any travel  outside or inside the [place], visit the CDC's Travelers' Health webpage for the latest health notices. . If you have some symptoms but not all symptoms, continue to monitor at home and seek medical attention  . if your symptoms worsen. . If you are having a medical emergency, call 911. >>>>>>>>>>>>>>>>>>>>>>>> Preventive Care for Adults  A healthy lifestyle and preventive care can promote health and wellness. Preventive health guidelines for men include the following key practices:  A routine yearly physical is a good way to check with your health care provider about your health and preventative screening. It is a chance to share any concerns and updates on your health and to receive a thorough exam.  Visit your dentist for a routine exam and preventative care every 6 months. Brush your teeth twice a day and floss once a day. Good oral hygiene prevents tooth decay and gum disease.  The frequency of eye exams is based on your age, health, family medical history, use of contact lenses, and other factors. Follow your health care provider's recommendations for frequency of eye exams.  Eat a healthy diet. Foods such as vegetables, fruits, whole grains, low-fat dairy products, and lean protein foods contain the nutrients you need without too many calories. Decrease your intake of foods high in solid fats, added sugars, and salt. Eat the right amount of calories for you. Get information about a proper diet from your health care provider, if necessary.  Regular physical exercise is one of the most important things you can do for your health. Most adults should get at least 150 minutes of moderate-intensity exercise (any activity that increases your heart rate and causes you to sweat) each week. In addition, most adults need muscle-strengthening exercises on 2 or more days a week.  Maintain a healthy weight. The body mass index (BMI) is a screening tool to identify possible weight problems. It provides an  estimate of body fat based on height and weight. Your health care provider can find your BMI and can help you achieve or maintain a healthy weight. For adults 20 years and older:  A BMI below 18.5 is considered underweight.  A BMI of 18.5 to 24.9 is normal.  A BMI of 25 to 29.9 is considered overweight.  A BMI of 30 and above is considered obese.  Maintain normal blood lipids and cholesterol levels by exercising and minimizing your intake of saturated fat. Eat a balanced diet with plenty of fruit and vegetables. Blood tests for lipids and cholesterol should begin at age 45 and be repeated every 5 years. If your lipid or cholesterol levels are high, you are over 50, or you are at high risk for heart disease, you may need your cholesterol levels checked more frequently. Ongoing high lipid and cholesterol levels should be treated  with medicines if diet and exercise are not working.  If you smoke, find out from your health care provider how to quit. If you do not use tobacco, do not start.  Lung cancer screening is recommended for adults aged 2-80 years who are at high risk for developing lung cancer because of a history of smoking. A yearly low-dose CT scan of the lungs is recommended for people who have at least a 30-pack-year history of smoking and are a current smoker or have quit within the past 15 years. A pack year of smoking is smoking an average of 1 pack of cigarettes a day for 1 year (for example: 1 pack a day for 30 years or 2 packs a day for 15 years). Yearly screening should continue until the smoker has stopped smoking for at least 15 years. Yearly screening should be stopped for people who develop a health problem that would prevent them from having lung cancer treatment.  If you choose to drink alcohol, do not have more than 2 drinks per day. One drink is considered to be 12 ounces (355 mL) of beer, 5 ounces (148 mL) of wine, or 1.5 ounces (44 mL) of liquor.  High blood pressure  causes heart disease and increases the risk of stroke. Your blood pressure should be checked. Ongoing high blood pressure should be treated with medicines, if weight loss and exercise are not effective.  If you are 35-59 years old, ask your health care provider if you should take aspirin to prevent heart disease.  Diabetes screening involves taking a blood sample to check your fasting blood sugar level. Testing should be considered at a younger age or be carried out more frequently if you are overweight and have at least 1 risk factor for diabetes.  Colorectal cancer can be detected and often prevented. Most routine colorectal cancer screening begins at the age of 50 and continues through age 67. However, your health care provider may recommend screening at an earlier age if you have risk factors for colon cancer. On a yearly basis, your health care provider may provide home test kits to check for hidden blood in the stool. Use of a small camera at the end of a tube to directly examine the colon (sigmoidoscopy or colonoscopy) can detect the earliest forms of colorectal cancer. Talk to your health care provider about this at age 75, when routine screening begins. Direct exam of the colon should be repeated every 5-10 years through age 50, unless early forms of precancerous polyps or small growths are found.  Screening for abdominal aortic aneurysm (AAA)  are recommended for persons over age 31 who have history of hypertensionor who are current or former smokers.  Talk with your health care provider about prostate cancer screening.  Testicular cancer screening is recommended for adult males. Screening includes self-exam, a health care provider exam, and other screening tests. Consult with your health care provider about any symptoms you have or any concerns you have about testicular cancer.  Use sunscreen. Apply sunscreen liberally and repeatedly throughout the day. You should seek shade when your shadow  is shorter than you. Protect yourself by wearing long sleeves, pants, a wide-brimmed hat, and sunglasses year round, whenever you are outdoors.  Once a month, do a whole-body skin exam, using a mirror to look at the skin on your back. Tell your health care provider about new moles, moles that have irregular borders, moles that are larger than a pencil eraser, or moles that  have changed in shape or color.  Stay current with required vaccines (immunizations).  Influenza vaccine. All adults should be immunized every year.  Tetanus, diphtheria, and acellular pertussis (Td, Tdap) vaccine. An adult who has not previously received Tdap or who does not know his vaccine status should receive 1 dose of Tdap. This initial dose should be followed by tetanus and diphtheria toxoids (Td) booster doses every 10 years. Adults with an unknown or incomplete history of completing a 3-dose immunization series with Td-containing vaccines should begin or complete a primary immunization series including a Tdap dose. Adults should receive a Td booster every 10 years.  Zoster vaccine. One dose is recommended for adults aged 60 years or older unless certain conditions are present.    Pneumococcal 13-valent conjugate (PCV13) vaccine. When indicated, a person who is uncertain of his immunization history and has no record of immunization should receive the PCV13 vaccine. An adult aged 19 years or older who has certain medical conditions and has not been previously immunized should receive 1 dose of PCV13 vaccine. This PCV13 should be followed with a dose of pneumococcal polysaccharide (PPSV23) vaccine. The PPSV23 vaccine dose should be obtained at least 8 weeks after the dose of PCV13 vaccine. An adult aged 19 years or older who has certain medical conditions and previously received 1 or more doses of PPSV23 vaccine should receive 1 dose of PCV13. The PCV13 vaccine dose should be obtained 1 or more years after the last PPSV23  vaccine dose.    Pneumococcal polysaccharide (PPSV23) vaccine. When PCV13 is also indicated, PCV13 should be obtained first. All adults aged 65 years and older should be immunized. An adult younger than age 65 years who has certain medical conditions should be immunized. Any person who resides in a nursing home or long-term care facility should be immunized. An adult smoker should be immunized. People with an immunocompromised condition and certain other conditions should receive both PCV13 and PPSV23 vaccines. People with human immunodeficiency virus (HIV) infection should be immunized as soon as possible after diagnosis. Immunization during chemotherapy or radiation therapy should be avoided. Routine use of PPSV23 vaccine is not recommended for American Indians, [place] Natives, or people younger than 65 years unless there are medical conditions that require PPSV23 vaccine. When indicated, people who have unknown immunization and have no record of immunization should receive PPSV23 vaccine. One-time revaccination 5 years after the first dose of PPSV23 is recommended for people aged 19-64 years who have chronic kidney failure, nephrotic syndrome, asplenia, or immunocompromised conditions. People who received 1-2 doses of PPSV23 before age 65 years should receive another dose of PPSV23 vaccine at age 65 years or later if at least 5 years have passed since the previous dose. Doses of PPSV23 are not needed for people immunized with PPSV23 at or after age 65 years.  Hepatitis A vaccine. Adults who wish to be protected from this disease, have certain high-risk conditions, work with hepatitis A-infected animals, work in hepatitis A research labs, or travel to or work in countries with a high rate of hepatitis A should be immunized. Adults who were previously unvaccinated and who anticipate close contact with an international adoptee during the first 60 days after arrival in the [place] from a country with a  high rate of hepatitis A should be immunized.  Hepatitis B vaccine. Adults should be immunized if they wish to be protected from this disease, have certain high-risk conditions, may be exposed to blood or other infectious   body fluids, are household contacts or sex partners of hepatitis B positive people, are clients or workers in certain care facilities, or travel to or work in countries with a high rate of hepatitis B.  Preventive Service / Frequency  Ages 19 to 28  Blood pressure check.  Lipid and cholesterol check.  Hepatitis C blood test.** / For any individual with known risks for hepatitis C.  Skin self-exam. / Monthly.  Influenza vaccine. / Every year.  Tetanus, diphtheria, and acellular pertussis (Tdap, Td) vaccine.** / Consult your health care provider. 1 dose of Td every 10 years.  HPV vaccine. / 3 doses over 6 months, if 49 or younger.  Measles, mumps, rubella (MMR) vaccine.** / You need at least 1 dose of MMR if you were born in 1957 or later. You may also need a second dose.  Pneumococcal 13-valent conjugate (PCV13) vaccine.** / Consult your health care provider.  Pneumococcal polysaccharide (PPSV23) vaccine.** / 1 to 2 doses if you smoke cigarettes or if you have certain conditions.  Meningococcal vaccine.** / 1 dose if you are age 33 to 88 years and a Market researcher living in a residence hall, or have one of several medical conditions. You may also need additional booster doses.  Hepatitis A vaccine.** / Consult your health care provider.  Hepatitis B vaccine.** / Consult your health care provider. +++++++++ Recommend Adult Low Dose Aspirin or  coated  Aspirin 81 mg daily  To reduce risk of Colon Cancer 40 %,  Skin Cancer 26 % ,  Melanoma 46%  and  Pancreatic cancer 60% ++++++++++++++++++ Vitamin D goal  is between 70-100.  Please make sure that you are taking your Vitamin D as directed.  It is very important as a natural anti-inflammatory   helping hair, skin, and nails, as well as reducing stroke and heart attack risk.  It helps your bones and helps with mood. It also decreases numerous cancer risks so please take it as directed.  Low Vit D is associated with a 200-300% higher risk for CANCER  and 200-300% higher risk for HEART   ATTACK  &  STROKE.   .....................................Marland Kitchen It is also associated with higher death rate at younger ages,  autoimmune diseases like Rheumatoid arthritis, Lupus, Multiple Sclerosis.    Also many other serious conditions, like depression, Alzheimer's Dementia, infertility, muscle aches, fatigue, fibromyalgia - just to name a few. +++++++++++++++++++++ Recommend the book "The END of DIETING" by Dr Excell Seltzer  & the book "The END of DIABETES " by Dr Excell Seltzer At [email or web address] - get book & Audio CD's    Being diabetic has a  300% increased risk for heart attack, stroke, cancer, and alzheimer- type vascular dementia. It is very important that you work harder with diet by avoiding all foods that are white. Avoid white rice (brown & wild rice is OK), white potatoes (sweetpotatoes in moderation is OK), White bread or wheat bread or anything made out of white flour like bagels, donuts, rolls, buns, biscuits, cakes, pastries, cookies, pizza crust, and pasta (made from white flour & egg whites) - vegetarian pasta or spinach or wheat pasta is OK. Multigrain breads like Arnold's or [employer], or multigrain sandwich thins or flatbreads.  Diet, exercise and weight loss can reverse and cure diabetes in the early stages.  Diet, exercise and weight loss is very important in the control and prevention of complications of diabetes which affects every system in your body, ie. Brain - dementia/stroke, eyes -  glaucoma/blindness, heart - heart attack/heart failure, kidneys - dialysis, stomach - gastric paralysis, intestines - malabsorption, nerves - severe painful neuritis, circulation - gangrene & loss of a  leg(s), and finally cancer and Alzheimers.    I recommend avoid fried & greasy foods,  sweets/candy, white rice (brown or wild rice or Quinoa is OK), white potatoes (sweet potatoes are OK) - anything made from white flour - bagels, doughnuts, rolls, buns, biscuits,white and wheat breads, pizza crust and traditional pasta made of white flour & egg white(vegetarian pasta or spinach or wheat pasta is OK).  Multi-grain bread is OK - like multi-grain flat bread or sandwich thins. Avoid alcohol in excess. Exercise is also important.    Eat all the vegetables you want - avoid meat, especially red meat and dairy - especially cheese.  Cheese is the most concentrated form of trans-fats which is the worst thing to clog up our arteries. Veggie cheese is OK which can be found in the fresh produce section at [employer] or [employer] or [employer]  +++++++++++++++++++ DASH Eating Plan  DASH stands for "Dietary Approaches to Stop Hypertension."   The DASH eating plan is a healthy eating plan that has been shown to reduce high blood pressure (hypertension). Additional health benefits may include reducing the risk of type 2 diabetes mellitus, heart disease, and stroke. The DASH eating plan may also help with weight loss. WHAT DO I NEED TO KNOW ABOUT THE DASH EATING PLAN? For the DASH eating plan, you will follow these general guidelines:  Choose foods with a percent daily value for sodium of less than 5% (as listed on the food label).  Use salt-free seasonings or herbs instead of table salt or sea salt.  Check with your health care provider or pharmacist before using salt substitutes.  Eat lower-sodium products, often labeled as "lower sodium" or "no salt added."  Eat fresh foods.  Eat more vegetables, fruits, and low-fat dairy products.  Choose whole grains. Look for the word "whole" as the first word in the ingredient list.  Choose fish   Limit sweets, desserts, sugars, and sugary drinks.  Choose  heart-healthy fats.  Eat veggie cheese   Eat more home-cooked food and less restaurant, buffet, and fast food.  Limit fried foods.  Cook foods using methods other than frying.  Limit canned vegetables. If you do use them, rinse them well to decrease the sodium.  When eating at a restaurant, ask that your food be prepared with less salt, or no salt if possible.                      WHAT FOODS CAN I EAT? Read Dr Fara Olden Fuhrman's books on The End of Dieting & The End of Diabetes  Grains Whole grain or whole wheat bread. Brown rice. Whole grain or whole wheat pasta. Quinoa, bulgur, and whole grain cereals. Low-sodium cereals. Corn or whole wheat flour tortillas. Whole grain cornbread. Whole grain crackers. Low-sodium crackers.  Vegetables Fresh or frozen vegetables (raw, steamed, roasted, or grilled). Low-sodium or reduced-sodium tomato and vegetable juices. Low-sodium or reduced-sodium tomato sauce and paste. Low-sodium or reduced-sodium canned vegetables.   Fruits All fresh, canned (in natural juice), or frozen fruits.  Protein Products  All fish and seafood.  Dried beans, peas, or lentils. Unsalted nuts and seeds. Unsalted canned beans.  Dairy Low-fat dairy products, such as skim or 1% milk, 2% or reduced-fat cheeses, low-fat ricotta or cottage cheese, or plain low-fat yogurt. Low-sodium  or reduced-sodium cheeses.  Fats and Oils Tub margarines without trans fats. Light or reduced-fat mayonnaise and salad dressings (reduced sodium). Avocado. Safflower, olive, or canola oils. Natural peanut or almond butter.  Other Unsalted popcorn and pretzels. The items listed above may not be a complete list of recommended foods or beverages. Contact your dietitian for more options.  +++++++++++++++++++  WHAT FOODS ARE NOT RECOMMENDED? Grains/ White flour or wheat flour White bread. White pasta. White rice. Refined cornbread. Bagels and croissants. Crackers that contain trans  fat.  Vegetables  Creamed or fried vegetables. Vegetables in a . Regular canned vegetables. Regular canned tomato sauce and paste. Regular tomato and vegetable juices.  Fruits Dried fruits. Canned fruit in light or heavy syrup. Fruit juice.  Meat and Other Protein Products Meat in general - RED meat & White meat.  Fatty cuts of meat. Ribs, chicken wings, all processed meats as bacon, sausage, bologna, salami, fatback, hot dogs, bratwurst and packaged luncheon meats.  Dairy Whole or 2% milk, cream, half-and-half, and cream cheese. Whole-fat or sweetened yogurt. Full-fat cheeses or blue cheese. Non-dairy creamers and whipped toppings. Processed cheese, cheese spreads, or cheese curds.  Condiments Onion and garlic salt, seasoned salt, table salt, and sea salt. Canned and packaged gravies. Worcestershire sauce. Tartar sauce. Barbecue sauce. Teriyaki sauce. Soy sauce, including reduced sodium. Steak sauce. Fish sauce. Oyster sauce. Cocktail sauce. Horseradish. Ketchup and mustard. Meat flavorings and tenderizers. Bouillon cubes. Hot sauce. Tabasco sauce. Marinades. Taco seasonings. Relishes.  Fats and Oils Butter, stick margarine, lard, shortening and bacon fat. Coconut, palm kernel, or palm oils. Regular salad dressings.  Pickles and olives. Salted popcorn and pretzels.  The items listed above may not be a complete list of foods and beverages to avoid.

## 2020-04-20 ENCOUNTER — Other Ambulatory Visit: Payer: Self-pay

## 2020-04-20 ENCOUNTER — Ambulatory Visit (INDEPENDENT_AMBULATORY_CARE_PROVIDER_SITE_OTHER): Payer: Medicare Other | Admitting: Internal Medicine

## 2020-04-20 VITALS — BP 120/84 | HR 64 | Temp 97.2°F | Resp 16 | Ht 67.5 in | Wt 162.4 lb

## 2020-04-20 DIAGNOSIS — R7303 Prediabetes: Secondary | ICD-10-CM

## 2020-04-20 DIAGNOSIS — Z136 Encounter for screening for cardiovascular disorders: Secondary | ICD-10-CM

## 2020-04-20 DIAGNOSIS — R0989 Other specified symptoms and signs involving the circulatory and respiratory systems: Secondary | ICD-10-CM

## 2020-04-20 DIAGNOSIS — E559 Vitamin D deficiency, unspecified: Secondary | ICD-10-CM | POA: Diagnosis not present

## 2020-04-20 DIAGNOSIS — R5383 Other fatigue: Secondary | ICD-10-CM

## 2020-04-20 DIAGNOSIS — Z79899 Other long term (current) drug therapy: Secondary | ICD-10-CM

## 2020-04-20 DIAGNOSIS — R7309 Other abnormal glucose: Secondary | ICD-10-CM | POA: Diagnosis not present

## 2020-04-20 DIAGNOSIS — F17200 Nicotine dependence, unspecified, uncomplicated: Secondary | ICD-10-CM

## 2020-04-20 DIAGNOSIS — E782 Mixed hyperlipidemia: Secondary | ICD-10-CM

## 2020-04-20 DIAGNOSIS — S069X0S Unspecified intracranial injury without loss of consciousness, sequela: Secondary | ICD-10-CM

## 2020-04-20 DIAGNOSIS — Z8249 Family history of ischemic heart disease and other diseases of the circulatory system: Secondary | ICD-10-CM

## 2020-04-21 LAB — LIPID PANEL
Cholesterol: 155 mg/dL (ref <200)
HDL: 45 mg/dL (ref >=40)
LDL Cholesterol (Calc): 93 mg/dL (calc)
Non-HDL Cholesterol (Calc): 110 mg/dL (calc) (ref <130)
Total CHOL/HDL Ratio: 3.4 (calc) (ref <5.0)
Triglycerides: 77 mg/dL (ref <150)

## 2020-04-21 LAB — URINALYSIS, ROUTINE W REFLEX MICROSCOPIC
Bilirubin Urine: NEGATIVE
Glucose, UA: NEGATIVE
Hgb urine dipstick: NEGATIVE
Leukocytes,Ua: NEGATIVE
Nitrite: NEGATIVE
Protein, ur: NEGATIVE
Specific Gravity, Urine: 1.023 (ref 1.001–1.03)
pH: 5.5 (ref 5.0–8.0)

## 2020-04-21 LAB — MICROALBUMIN / CREATININE URINE RATIO
Creatinine, Urine: 202 mg/dL (ref 20–320)
Microalb Creat Ratio: 2 mcg/mg creat (ref <30)
Microalb, Ur: 0.5 mg/dL

## 2020-04-21 LAB — CBC WITH DIFFERENTIAL/PLATELET
Absolute Monocytes: 332 cells/uL (ref 200–950)
Basophils Absolute: 52 cells/uL (ref 0–200)
Basophils Relative: 1.3 %
Eosinophils Absolute: 92 cells/uL (ref 15–500)
Eosinophils Relative: 2.3 %
HCT: 46.9 % (ref 38.5–50.0)
Hemoglobin: 15.3 g/dL (ref 13.2–17.1)
Lymphs Abs: 1376 cells/uL (ref 850–3900)
MCH: 28.1 pg (ref 27.0–33.0)
MCHC: 32.6 g/dL (ref 32.0–36.0)
MCV: 86.2 fL (ref 80.0–100.0)
MPV: 11.2 fL (ref 7.5–12.5)
Monocytes Relative: 8.3 %
Neutro Abs: 2148 cells/uL (ref 1500–7800)
Neutrophils Relative %: 53.7 %
Platelets: 208 10*3/uL (ref 140–400)
RBC: 5.44 10*6/uL (ref 4.20–5.80)
RDW: 13.1 % (ref 11.0–15.0)
Total Lymphocyte: 34.4 %
WBC: 4 10*3/uL (ref 3.8–10.8)

## 2020-04-21 LAB — COMPLETE METABOLIC PANEL WITH GFR
AG Ratio: 2.1 (calc) (ref 1.0–2.5)
ALT: 7 U/L — ABNORMAL LOW (ref 9–46)
AST: 14 U/L (ref 10–40)
Albumin: 4.6 g/dL (ref 3.6–5.1)
Alkaline phosphatase (APISO): 41 U/L (ref 36–130)
BUN: 11 mg/dL (ref 7–25)
CO2: 28 mmol/L (ref 20–32)
Calcium: 9.5 mg/dL (ref 8.6–10.3)
Chloride: 102 mmol/L (ref 98–110)
Creat: 0.95 mg/dL (ref 0.60–1.35)
GFR, Est African American: 117 mL/min/{1.73_m2} (ref >=60)
GFR, Est Non African American: 101 mL/min/{1.73_m2} (ref >=60)
Globulin: 2.2 g/dL (calc) (ref 1.9–3.7)
Glucose, Bld: 88 mg/dL (ref 65–99)
Potassium: 4.2 mmol/L (ref 3.5–5.3)
Sodium: 139 mmol/L (ref 135–146)
Total Bilirubin: 0.6 mg/dL (ref 0.2–1.2)
Total Protein: 6.8 g/dL (ref 6.1–8.1)

## 2020-04-21 LAB — HEMOGLOBIN A1C
Hgb A1c MFr Bld: 5.3 % of total Hgb (ref <5.7)
Mean Plasma Glucose: 105 (calc)
eAG (mmol/L): 5.8 (calc)

## 2020-04-21 LAB — TESTOSTERONE: Testosterone: 526 ng/dL (ref 250–827)

## 2020-04-21 LAB — INSULIN, RANDOM: Insulin: 11.5 u[IU]/mL

## 2020-04-21 LAB — MAGNESIUM: Magnesium: 1.7 mg/dL (ref 1.5–2.5)

## 2020-04-21 LAB — TSH: TSH: 0.78 mIU/L (ref 0.40–4.50)

## 2020-04-21 LAB — VITAMIN D 25 HYDROXY (VIT D DEFICIENCY, FRACTURES): Vit D, 25-Hydroxy: 46 ng/mL (ref 30–100)

## 2020-04-21 NOTE — Progress Notes (Signed)
========================================================== -   Test results slightly outside the reference range are not unusual. If there is anything important, I will review this with you,  otherwise it is considered normal test values.  If you have further questions,  please do not hesitate to contact me at the office or via My Chart.  ==========================================================  -  Testosterone Level - Normal  ==========================================================  -   Magnesium  -   1.7   -  very  low- goal is betw 2.0 - 2.5,   - So..............Marland Kitchen  Recommend that you take  Magnesium 500 mg tablet  x 2 tablets /daily = 1,000 mg  /day   - also important to eat lots of  leafy green vegetables   - spinach - Kale - collards - greens - okra - asparagus  - broccoli - quinoa - squash - almonds   - black, red, white beans  -  peas - green beans ==========================================================  -  Chol = 155 and LDL 93 - Both  Excellent   - Very low risk for Heart Attack  / Stroke =============================================================  - A1c - Normal -No Diabetes - Great  ! ==========================================================  -  Vitamin D = 46 - Low   - Vitamin D goal is between 70-100.   $$$$$$$$$$$$$$$$$$$$$$$$$$$$$$$$$$$$$$$$$$$$$$$$$$$$$$$$$$$$  - Please  INCREASE your Vitamin D up to 5,000 unit capsules and take   2 capsules= 10,000 units /day  !  $$$$$$$$$$$$$$$$$$$$$$$$$$$$$$$$$$$$$$$$$$$$$$$$$$$$$$$$$$$$  - It is very important as a natural anti-inflammatory and helping the  immune system protect against viral infections, like the Covid-19    helping hair, skin, and nails, as well as reducing stroke and  heart attack risk.   - It helps your bones and helps with mood.  - It also decreases numerous cancer risks so please take  it as directed.   - Low Vit D is associated with a 200-300% higher risk for CANCER   and  200-300% higher risk for HEART   ATTACK  &  STROKE.    - It is also associated with higher death rate at younger ages,   autoimmune diseases like Rheumatoid arthritis, Lupus,  Multiple Sclerosis.     - Also many other serious conditions, like depression, Alzheimer's  Dementia, infertility, muscle aches, fatigue, fibromyalgia  - just to name a few.  ===========================================================  - All Else - CBC - Kidneys -  U/A - Electrolytes - Liver  - Magnesium & Thyroid    - all  Normal / OK ===========================================================

## 2020-06-04 ENCOUNTER — Other Ambulatory Visit: Payer: Self-pay | Admitting: Internal Medicine

## 2020-08-04 ENCOUNTER — Ambulatory Visit: Payer: Medicare Other | Admitting: Adult Health
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
## 2020-10-25 ENCOUNTER — Encounter: Payer: Self-pay | Admitting: Adult Health

## 2020-10-25 ENCOUNTER — Ambulatory Visit (INDEPENDENT_AMBULATORY_CARE_PROVIDER_SITE_OTHER): Payer: Medicare Other | Admitting: Adult Health

## 2020-10-25 ENCOUNTER — Other Ambulatory Visit: Payer: Self-pay

## 2020-10-25 VITALS — BP 116/82 | HR 69 | Temp 97.5°F | Wt 162.0 lb

## 2020-10-25 DIAGNOSIS — F1021 Alcohol dependence, in remission: Secondary | ICD-10-CM | POA: Diagnosis not present

## 2020-10-25 DIAGNOSIS — R6889 Other general symptoms and signs: Secondary | ICD-10-CM | POA: Diagnosis not present

## 2020-10-25 DIAGNOSIS — B351 Tinea unguium: Secondary | ICD-10-CM | POA: Insufficient documentation

## 2020-10-25 DIAGNOSIS — F3341 Major depressive disorder, recurrent, in partial remission: Secondary | ICD-10-CM

## 2020-10-25 DIAGNOSIS — Z0000 Encounter for general adult medical examination without abnormal findings: Secondary | ICD-10-CM

## 2020-10-25 DIAGNOSIS — L700 Acne vulgaris: Secondary | ICD-10-CM | POA: Diagnosis not present

## 2020-10-25 DIAGNOSIS — E559 Vitamin D deficiency, unspecified: Secondary | ICD-10-CM

## 2020-10-25 DIAGNOSIS — R569 Unspecified convulsions: Secondary | ICD-10-CM

## 2020-10-25 DIAGNOSIS — S069X0S Unspecified intracranial injury without loss of consciousness, sequela: Secondary | ICD-10-CM

## 2020-10-25 DIAGNOSIS — E782 Mixed hyperlipidemia: Secondary | ICD-10-CM

## 2020-10-25 DIAGNOSIS — I10 Essential (primary) hypertension: Secondary | ICD-10-CM

## 2020-10-25 DIAGNOSIS — R7309 Other abnormal glucose: Secondary | ICD-10-CM

## 2020-10-25 DIAGNOSIS — Z0001 Encounter for general adult medical examination with abnormal findings: Secondary | ICD-10-CM | POA: Diagnosis not present

## 2020-10-25 DIAGNOSIS — Z79899 Other long term (current) drug therapy: Secondary | ICD-10-CM

## 2020-10-25 DIAGNOSIS — Z72811 Adult antisocial behavior: Secondary | ICD-10-CM | POA: Diagnosis not present

## 2020-10-25 DIAGNOSIS — F520 Hypoactive sexual desire disorder: Secondary | ICD-10-CM

## 2020-10-25 DIAGNOSIS — Z1159 Encounter for screening for other viral diseases: Secondary | ICD-10-CM

## 2020-10-25 DIAGNOSIS — IMO0002 Reserved for concepts with insufficient information to code with codable children: Secondary | ICD-10-CM

## 2020-10-25 MED ORDER — TERBINAFINE HCL 250 MG PO TABS: 250.0000 mg | ORAL_TABLET | Freq: Every day | ORAL | 0 refills | Status: AC

## 2020-10-25 NOTE — Patient Instructions (Signed)
Troy [name] , Thank you for taking time to come for your Medicare Wellness Visit. I appreciate your ongoing commitment to your health goals. Please review the following plan we discussed and let me know if I can assist you in the future.   These are the goals we discussed: Goals    . DIET - INCREASE WATER INTAKE     Start with 1 bottle of water daily, and gradually increase for goal of 65+ fluid ounces, or 4-5 bottles daily     . Exercise 150 min/wk Moderate Activity    . Quit Smoking       This is a list of the screening recommended for you and due dates:  Health Maintenance  Topic Date Due  .  Hepatitis C: One time screening is recommended by Center for Disease Control  (CDC) for  adults born from [birth year] through 1964.   Never done  . Flu Shot  02/21/2021  . Tetanus Vaccine  07/04/2022  . COVID-19 Vaccine  Completed  . HPV Vaccine  Aged Out  . HIV Screening  Discontinued     Terbinafine tablets What is this medicine? TERBINAFINE (TER bin a feen) is an antifungal medicine. It is used to treat certain kinds of fungal or yeast infections. This medicine may be used for other purposes; ask your health care provider or pharmacist if you have questions. COMMON BRAND NAME(S): Lamisil, Terbinex What should I tell my health care provider before I take this medicine? They need to know if you have any of these conditions:  drink alcoholic beverages  kidney disease  liver disease  an unusual or allergic reaction to terbinafine, other medicines, foods, dyes, or preservatives  pregnant or trying to get pregnant  breast-feeding How should I use this medicine? Take this medicine by mouth with a full glass of water. Follow the directions on the prescription label. You can take this medicine with food or on an empty stomach. Take your medicine at regular intervals. Do not take your medicine more often than directed. Do not skip doses or stop your medicine early even if you feel better. Do not  stop taking except on your doctor's advice. A special MedGuide will be given to you by the pharmacist with each prescription and refill. Be sure to read this information carefully each time. Talk to your pediatrician regarding the use of this medicine in children. Special care may be needed. Overdosage: If you think you have taken too much of this medicine contact a poison control center or emergency room at once. NOTE: This medicine is only for you. Do not share this medicine with others. What if I miss a dose? If you miss a dose, take it as soon as you can. If it is almost time for your next dose, take only that dose. Do not take double or extra doses. What may interact with this medicine? Do not take this medicine with any of the following medications:  thioridazine This medicine may also interact with the following medications:  beta-blockers  caffeine  cimetidine  cyclosporine  medicines for depression, anxiety, or psychotic disturbances  medicines for fungal infections like fluconazole and ketoconazole  medicines for irregular heartbeat like amiodarone, flecainide and propafenone  rifampin  warfarin This list may not describe all possible interactions. Give your health care provider a list of all the medicines, herbs, non-prescription drugs, or dietary supplements you use. Also tell them if you smoke, drink alcohol, or use illegal drugs. Some items may interact  with your medicine. What should I watch for while using this medicine? Visit your doctor or health care provider regularly. Tell your doctor right away if you have nausea or vomiting, loss of appetite, stomach pain on your right upper side, yellow skin, dark urine, light stools, or are over tired. Some fungal infections need many weeks or months of treatment to cure. If you are taking this medicine for a long time, you will need to have important blood work done. This medicine may cause serious skin reactions. They can  happen weeks to months after starting the medicine. Contact your health care provider right away if you notice fevers or flu-like symptoms with a rash. The rash may be red or purple and then turn into blisters or peeling of the skin. Or, you might notice a red rash with swelling of the face, lips or lymph nodes in your neck or under your arms. What side effects may I notice from receiving this medicine? Side effects that you should report to your doctor or health care professional as soon as possible:  allergic reactions like skin rash or hives, swelling of the face, lips, or tongue  changes in vision  dark urine  fever or infection  general ill feeling or flu-like symptoms  light-colored stools  loss of appetite, nausea  rash, fever, and swollen lymph nodes  redness, blistering, peeling or loosening of the skin, including inside the mouth  right upper belly pain  unusually weak or tired  yellowing of the eyes or skin Side effects that usually do not require medical attention (report to your doctor or health care professional if they continue or are bothersome):  changes in taste  diarrhea  hair loss  muscle or joint pain  stomach gas  stomach upset This list may not describe all possible side effects. Call your doctor for medical advice about side effects. You may report side effects to FDA at 1-800-FDA-1088. Where should I keep my medicine? Keep out of the reach of children. Store at room temperature below 25 degrees C (77 degrees F). Protect from light. Throw away any unused medicine after the expiration date. NOTE: This sheet is a summary. It may not cover all possible information. If you have questions about this medicine, talk to your doctor, pharmacist, or health care provider.  2021 Elsevier/Gold Standard (2018-10-18 15:37:07)

## 2020-10-26 LAB — CBC WITH DIFFERENTIAL/PLATELET
Absolute Monocytes: 331 cells/uL (ref 200–950)
Basophils Absolute: 69 cells/uL (ref 0–200)
Basophils Relative: 1.5 %
Eosinophils Absolute: 69 cells/uL (ref 15–500)
Eosinophils Relative: 1.5 %
HCT: 47.2 % (ref 38.5–50.0)
Hemoglobin: 15.8 g/dL (ref 13.2–17.1)
Lymphs Abs: 1067 cells/uL (ref 850–3900)
MCH: 29 pg (ref 27.0–33.0)
MCHC: 33.5 g/dL (ref 32.0–36.0)
MCV: 86.8 fL (ref 80.0–100.0)
MPV: 11.3 fL (ref 7.5–12.5)
Monocytes Relative: 7.2 %
Neutro Abs: 3064 cells/uL (ref 1500–7800)
Neutrophils Relative %: 66.6 %
Platelets: 201 10*3/uL (ref 140–400)
RBC: 5.44 10*6/uL (ref 4.20–5.80)
RDW: 13 % (ref 11.0–15.0)
Total Lymphocyte: 23.2 %
WBC: 4.6 10*3/uL (ref 3.8–10.8)

## 2020-10-26 LAB — COMPLETE METABOLIC PANEL WITH GFR
AG Ratio: 2.1 (calc) (ref 1.0–2.5)
ALT: 11 U/L (ref 9–46)
AST: 16 U/L (ref 10–40)
Albumin: 4.8 g/dL (ref 3.6–5.1)
Alkaline phosphatase (APISO): 41 U/L (ref 36–130)
BUN: 10 mg/dL (ref 7–25)
CO2: 27 mmol/L (ref 20–32)
Calcium: 9.5 mg/dL (ref 8.6–10.3)
Chloride: 104 mmol/L (ref 98–110)
Creat: 0.95 mg/dL (ref 0.60–1.35)
GFR, Est African American: 116 mL/min/{1.73_m2} (ref >=60)
GFR, Est Non African American: 100 mL/min/{1.73_m2} (ref >=60)
Globulin: 2.3 g/dL (calc) (ref 1.9–3.7)
Glucose, Bld: 104 mg/dL — ABNORMAL HIGH (ref 65–99)
Potassium: 4.4 mmol/L (ref 3.5–5.3)
Sodium: 140 mmol/L (ref 135–146)
Total Bilirubin: 0.5 mg/dL (ref 0.2–1.2)
Total Protein: 7.1 g/dL (ref 6.1–8.1)

## 2020-10-26 LAB — LIPID PANEL
Cholesterol: 157 mg/dL (ref <200)
HDL: 49 mg/dL (ref >=40)
LDL Cholesterol (Calc): 90 mg/dL (calc)
Non-HDL Cholesterol (Calc): 108 mg/dL (calc) (ref <130)
Total CHOL/HDL Ratio: 3.2 (calc) (ref <5.0)
Triglycerides: 88 mg/dL (ref <150)

## 2020-10-26 LAB — HEPATITIS C ANTIBODY
Hepatitis C Ab: NONREACTIVE
SIGNAL TO CUT-OFF: 0 (ref <1.00)

## 2020-10-26 LAB — TSH: TSH: 1.44 mIU/L (ref 0.40–4.50)

## 2020-10-26 LAB — VITAMIN D 25 HYDROXY (VIT D DEFICIENCY, FRACTURES): Vit D, 25-Hydroxy: 67 ng/mL (ref 30–100)

## 2020-10-26 LAB — MAGNESIUM: Magnesium: 1.7 mg/dL (ref 1.5–2.5)

## 2020-12-06 ENCOUNTER — Other Ambulatory Visit: Payer: Self-pay

## 2020-12-06 ENCOUNTER — Ambulatory Visit (INDEPENDENT_AMBULATORY_CARE_PROVIDER_SITE_OTHER): Payer: Medicare Other

## 2020-12-06 DIAGNOSIS — Z79899 Other long term (current) drug therapy: Secondary | ICD-10-CM

## 2020-12-06 DIAGNOSIS — B351 Tinea unguium: Secondary | ICD-10-CM

## 2020-12-06 NOTE — Progress Notes (Signed)
Patient presents to the office for a nurse visit to have labs done to recheck liver functions. No questions or concerns. Vitals taken and recorded.

## 2020-12-07 LAB — HEPATIC FUNCTION PANEL
AG Ratio: 2.5 (calc) (ref 1.0–2.5)
ALT: 10 U/L (ref 9–46)
AST: 15 U/L (ref 10–40)
Albumin: 4.9 g/dL (ref 3.6–5.1)
Alkaline phosphatase (APISO): 42 U/L (ref 36–130)
Bilirubin, Direct: 0.1 mg/dL (ref 0.0–0.2)
Globulin: 2 g/dL (calc) (ref 1.9–3.7)
Indirect Bilirubin: 0.4 mg/dL (calc) (ref 0.2–1.2)
Total Bilirubin: 0.5 mg/dL (ref 0.2–1.2)
Total Protein: 6.9 g/dL (ref 6.1–8.1)

## 2020-12-16 ENCOUNTER — Other Ambulatory Visit: Payer: Self-pay | Admitting: Adult Health

## 2021-05-04 ENCOUNTER — Encounter: Payer: Self-pay | Admitting: Internal Medicine

## 2021-05-04 NOTE — Patient Instructions (Signed)
Due to recent changes in healthcare laws, you may see the results of your imaging and laboratory studies on MyChart before your provider has had a chance to review them.  We understand that in some cases there may be results that are confusing or concerning to you. Not all laboratory results come back in the same time frame and the provider may be waiting for multiple results in order to interpret others.  Please give us 48 hours in order for your provider to thoroughly review all the results before contacting the office for clarification of your results.   +++++++++++++++++++++++++++++++++  Vit D  & Vit C 1,000 mg   are recommended to help protect  against the Covid-19 and other Corona viruses.    Also it's recommended  to take  Zinc 50 mg  to help  protect against the Covid-19   and best place to get  is also on [employer]  and don't pay more than 6-8 cents /pill !  ================================= Coronavirus (COVID-19) Are you at risk?  Are you at risk for the Coronavirus (COVID-19)?  To be considered HIGH RISK for Coronavirus (COVID-19), you have to meet the following criteria:  Traveled to [place], [place], [place], [place] or [place]; or in the [place] to [place], [place], [place]  or [place]; and have fever, cough, and shortness of breath within the last 2 weeks of travel OR Been in close contact with a person diagnosed with COVID-19 within the last 2 weeks and have  fever, cough,and shortness of breath  IF YOU DO NOT MEET THESE CRITERIA, YOU ARE CONSIDERED LOW RISK FOR COVID-19.  What to do if you are HIGH RISK for COVID-19?  If you are having a medical emergency, call 911. Seek medical care right away. Before you go to a doctor's office, urgent care or emergency department,  call ahead and tell them about your recent travel, contact with someone diagnosed with COVID-19   and your symptoms.  You should receive instructions from your physician's office  regarding next steps of care.  When you arrive at healthcare provider, tell the healthcare staff immediately you have returned from  visiting [place], [place], [place], [place] or [place]; or traveled in the [place] to [place], [place],  [place] or [place] in the last two weeks or you have been in close contact with a person diagnosed with  COVID-19 in the last 2 weeks.   Tell the health care staff about your symptoms: fever, cough and shortness of breath. After you have been seen by a medical provider, you will be either: Tested for (COVID-19) and discharged home on quarantine except to seek medical care if  symptoms worsen, and asked to  Stay home and avoid contact with others until you get your results (4-5 days)  Avoid travel on public transportation if possible (such as bus, train, or airplane) or Sent to the Emergency Department by EMS for evaluation, COVID-19 testing  and  possible admission depending on your condition and test results.  What to do if you are LOW RISK for COVID-19?  Reduce your risk of any infection by using the same precautions used for avoiding the common cold or flu:  Wash your hands often with soap and warm water for at least 20 seconds.  If soap and water are not readily available,  use an alcohol-based hand sanitizer with at least 60% alcohol.  If coughing or sneezing, cover your mouth and nose by coughing   or sneezing into the elbow areas of your shirt or coat,  into a tissue or into your sleeve (not your hands). Avoid shaking hands with others and consider head nods or verbal greetings only. Avoid touching your eyes, nose, or mouth with unwashed hands.  Avoid close contact with people who are sick. Avoid places or events with large numbers of people in one location, like concerts or sporting events. Carefully consider travel plans you have or are making. If you are planning any travel outside or inside the [place], visit the CDC's Travelers' Health  webpage for the latest health notices. If you have some symptoms but not all symptoms, continue to monitor at home and seek medical attention  if your symptoms worsen. If you are having a medical emergency, call 911. >>>>>>>>>>>>>>>>>>>>>>>> Preventive Care for Adults  A healthy lifestyle and preventive care can promote health and wellness. Preventive health guidelines for men include the following key practices: A routine yearly physical is a good way to check with your health care provider about your health and preventative screening. It is a chance to share any concerns and updates on your health and to receive a thorough exam. Visit your dentist for a routine exam and preventative care every 6 months. Brush your teeth twice a day and floss once a day. Good oral hygiene prevents tooth decay and gum disease. The frequency of eye exams is based on your age, health, family medical history, use of contact lenses, and other factors. Follow your health care provider's recommendations for frequency of eye exams. Eat a healthy diet. Foods such as vegetables, fruits, whole grains, low-fat dairy products, and lean protein foods contain the nutrients you need without too many calories. Decrease your intake of foods high in solid fats, added sugars, and salt. Eat the right amount of calories for you. Get information about a proper diet from your health care provider, if necessary. Regular physical exercise is one of the most important things you can do for your health. Most adults should get at least 150 minutes of moderate-intensity exercise (any activity that increases your heart rate and causes you to sweat) each week. In addition, most adults need muscle-strengthening exercises on 2 or more days a week. Maintain a healthy weight. The body mass index (BMI) is a screening tool to identify possible weight problems. It provides an estimate of body fat based on height and weight. Your health care provider can  find your BMI and can help you achieve or maintain a healthy weight. For adults 20 years and older: A BMI below 18.5 is considered underweight. A BMI of 18.5 to 24.9 is normal. A BMI of 25 to 29.9 is considered overweight. A BMI of 30 and above is considered obese. Maintain normal blood lipids and cholesterol levels by exercising and minimizing your intake of saturated fat. Eat a balanced diet with plenty of fruit and vegetables. Blood tests for lipids and cholesterol should begin at age 20 and be repeated every 5 years. If your lipid or cholesterol levels are high, you are over 50, or you are at high risk for heart disease, you may need your cholesterol levels checked more frequently. Ongoing high lipid and cholesterol levels should be treated with medicines if diet and exercise are not working. If you smoke, find out from your health care provider how to quit. If you do not use tobacco, do not start. Lung cancer screening is recommended for adults aged 55-80 years who are at high risk for   developing lung cancer because of a history of smoking. A yearly low-dose CT scan of the lungs is recommended for people who have at least a 30-pack-year history of smoking and are a current smoker or have quit within the past 15 years. A pack year of smoking is smoking an average of 1 pack of cigarettes a day for 1 year (for example: 1 pack a day for 30 years or 2 packs a day for 15 years). Yearly screening should continue until the smoker has stopped smoking for at least 15 years. Yearly screening should be stopped for people who develop a health problem that would prevent them from having lung cancer treatment. If you choose to drink alcohol, do not have more than 2 drinks per day. One drink is considered to be 12 ounces (355 mL) of beer, 5 ounces (148 mL) of wine, or 1.5 ounces (44 mL) of liquor. High blood pressure causes heart disease and increases the risk of stroke. Your blood pressure should be checked. Ongoing  high blood pressure should be treated with medicines, if weight loss and exercise are not effective. If you are 50-61 years old, ask your health care provider if you should take aspirin to prevent heart disease. Diabetes screening involves taking a blood sample to check your fasting blood sugar level. Testing should be considered at a younger age or be carried out more frequently if you are overweight and have at least 1 risk factor for diabetes. Colorectal cancer can be detected and often prevented. Most routine colorectal cancer screening begins at the age of 32 and continues through age 64. However, your health care provider may recommend screening at an earlier age if you have risk factors for colon cancer. On a yearly basis, your health care provider may provide home test kits to check for hidden blood in the stool. Use of a small camera at the end of a tube to directly examine the colon (sigmoidoscopy or colonoscopy) can detect the earliest forms of colorectal cancer. Talk to your health care provider about this at age 48, when routine screening begins. Direct exam of the colon should be repeated every 5-10 years through age 56, unless early forms of precancerous polyps or small growths are found. Screening for abdominal aortic aneurysm (AAA)  are recommended for persons over age 52 who have history of hypertensionor who are current or former smokers. Talk with your health care provider about prostate cancer screening. Testicular cancer screening is recommended for adult males. Screening includes self-exam, a health care provider exam, and other screening tests. Consult with your health care provider about any symptoms you have or any concerns you have about testicular cancer. Use sunscreen. Apply sunscreen liberally and repeatedly throughout the day. You should seek shade when your shadow is shorter than you. Protect yourself by wearing long sleeves, pants, a wide-brimmed hat, and sunglasses year  round, whenever you are outdoors. Once a month, do a whole-body skin exam, using a mirror to look at the skin on your back. Tell your health care provider about new moles, moles that have irregular borders, moles that are larger than a pencil eraser, or moles that have changed in shape or color. Stay current with required vaccines (immunizations). Influenza vaccine. All adults should be immunized every year. Tetanus, diphtheria, and acellular pertussis (Td, Tdap) vaccine. An adult who has not previously received Tdap or who does not know his vaccine status should receive 1 dose of Tdap. This initial dose should be followed by tetanus  and diphtheria toxoids (Td) booster doses every 10 years. Adults with an unknown or incomplete history of completing a 3-dose immunization series with Td-containing vaccines should begin or complete a primary immunization series including a Tdap dose. Adults should receive a Td booster every 10 years. Zoster vaccine. One dose is recommended for adults aged 84 years or older unless certain conditions are present.  Pneumococcal 13-valent conjugate (PCV13) vaccine. When indicated, a person who is uncertain of his immunization history and has no record of immunization should receive the PCV13 vaccine. An adult aged 18 years or older who has certain medical conditions and has not been previously immunized should receive 1 dose of PCV13 vaccine. This PCV13 should be followed with a dose of pneumococcal polysaccharide (PPSV23) vaccine. The PPSV23 vaccine dose should be obtained at least 8 weeks after the dose of PCV13 vaccine. An adult aged 36 years or older who has certain medical conditions and previously received 1 or more doses of PPSV23 vaccine should receive 1 dose of PCV13. The PCV13 vaccine dose should be obtained 1 or more years after the last PPSV23 vaccine dose.  Pneumococcal polysaccharide (PPSV23) vaccine. When PCV13 is also indicated, PCV13 should be obtained first. All  adults aged 40 years and older should be immunized. An adult younger than age 15 years who has certain medical conditions should be immunized. Any person who resides in a nursing home or long-term care facility should be immunized. An adult smoker should be immunized. People with an immunocompromised condition and certain other conditions should receive both PCV13 and PPSV23 vaccines. People with human immunodeficiency virus (HIV) infection should be immunized as soon as possible after diagnosis. Immunization during chemotherapy or radiation therapy should be avoided. Routine use of PPSV23 vaccine is not recommended for American Indians, [place], or people younger than 65 years unless there are medical conditions that require PPSV23 vaccine. When indicated, people who have unknown immunization and have no record of immunization should receive PPSV23 vaccine. One-time revaccination 5 years after the first dose of PPSV23 is recommended for people aged 19-64 years who have chronic kidney failure, nephrotic syndrome, asplenia, or immunocompromised conditions. People who received 1-2 doses of PPSV23 before age 30 years should receive another dose of PPSV23 vaccine at age 60 years or later if at least 5 years have passed since the previous dose. Doses of PPSV23 are not needed for people immunized with PPSV23 at or after age 48 years. Hepatitis A vaccine. Adults who wish to be protected from this disease, have certain high-risk conditions, work with hepatitis A-infected animals, work in hepatitis A research labs, or travel to or work in countries with a high rate of hepatitis A should be immunized. Adults who were previously unvaccinated and who anticipate close contact with an international adoptee during the first 60 days after arrival in the [place] from a country with a high rate of hepatitis A should be immunized. Hepatitis B vaccine. Adults should be immunized if they wish to be protected from this  disease, have certain high-risk conditions, may be exposed to blood or other infectious body fluids, are household contacts or sex partners of hepatitis B positive people, are clients or workers in certain care facilities, or travel to or work in countries with a high rate of hepatitis B.  Preventive Service / Frequency  Ages 6 to 1 Blood pressure check. Lipid and cholesterol check. Hepatitis C blood test.** / For any individual with known risks for hepatitis C. Skin self-exam. /  Monthly. Influenza vaccine. / Every year. Tetanus, diphtheria, and acellular pertussis (Tdap, Td) vaccine.** / Consult your health care provider. 1 dose of Td every 10 years. HPV vaccine. / 3 doses over 6 months, if 26 or younger. Measles, mumps, rubella (MMR) vaccine.** / You need at least 1 dose of MMR if you were born in 1957 or later. You may also need a second dose. Pneumococcal 13-valent conjugate (PCV13) vaccine.** / Consult your health care provider. Pneumococcal polysaccharide (PPSV23) vaccine.** / 1 to 2 doses if you smoke cigarettes or if you have certain conditions. Meningococcal vaccine.** / 1 dose if you are age 19 to 21 years and a first-year college student living in a residence hall, or have one of several medical conditions. You may also need additional booster doses. Hepatitis A vaccine.** / Consult your health care provider. Hepatitis B vaccine.** / Consult your health care provider. +++++++++ Recommend Adult Low Dose Aspirin or  coated  Aspirin 81 mg daily  To reduce risk of Colon Cancer 40 %,  Skin Cancer 26 % ,  Melanoma 46%  and  Pancreatic cancer 60% ++++++++++++++++++ Vitamin D goal  is between 70-100.  Please make sure that you are taking your Vitamin D as directed.  It is very important as a natural anti-inflammatory  helping hair, skin, and nails, as well as reducing stroke and heart attack risk.  It helps your bones and helps with mood. It also decreases numerous cancer risks  so please take it as directed.  Low Vit D is associated with a 200-300% higher risk for CANCER  and 200-300% higher risk for HEART   ATTACK  &  STROKE.   ...................................... It is also associated with higher death rate at younger ages,  autoimmune diseases like Rheumatoid arthritis, Lupus, Multiple Sclerosis.    Also many other serious conditions, like depression, Alzheimer's Dementia, infertility, muscle aches, fatigue, fibromyalgia - just to name a few. +++++++++++++++++++++ Recommend the book "The END of DIETING" by Dr Joel Fuhrman  & the book "The END of DIABETES " by Dr Joel Fuhrman At [employer] - get book & Audio CD's    Being diabetic has a  300% increased risk for heart attack, stroke, cancer, and alzheimer- type vascular dementia. It is very important that you work harder with diet by avoiding all foods that are white. Avoid white rice (brown & wild rice is OK), white potatoes (sweetpotatoes in moderation is OK), White bread or wheat bread or anything made out of white flour like bagels, donuts, rolls, buns, biscuits, cakes, pastries, cookies, pizza crust, and pasta (made from white flour & egg whites) - vegetarian pasta or spinach or wheat pasta is OK. Multigrain breads like Arnold's or [employer], or multigrain sandwich thins or flatbreads.  Diet, exercise and weight loss can reverse and cure diabetes in the early stages.  Diet, exercise and weight loss is very important in the control and prevention of complications of diabetes which affects every system in your body, ie. Brain - dementia/stroke, eyes - glaucoma/blindness, heart - heart attack/heart failure, kidneys - dialysis, stomach - gastric paralysis, intestines - malabsorption, nerves - severe painful neuritis, circulation - gangrene & loss of a leg(s), and finally cancer and Alzheimers.    I recommend avoid fried & greasy foods,  sweets/candy, white rice (brown or wild rice or Quinoa is OK), white potatoes  (sweet potatoes are OK) - anything made from white flour - bagels, doughnuts, rolls, buns, biscuits,white and wheat breads, pizza crust and   traditional pasta made of white flour & egg white(vegetarian pasta or spinach or wheat pasta is OK).  Multi-grain bread is OK - like multi-grain flat bread or sandwich thins. Avoid alcohol in excess. Exercise is also important.    Eat all the vegetables you want - avoid meat, especially red meat and dairy - especially cheese.  Cheese is the most concentrated form of trans-fats which is the worst thing to clog up our arteries. Veggie cheese is OK which can be found in the fresh produce section at [employer] or [employer] or [employer]  +++++++++++++++++++ DASH Eating Plan  DASH stands for "Dietary Approaches to Stop Hypertension."   The DASH eating plan is a healthy eating plan that has been shown to reduce high blood pressure (hypertension). Additional health benefits may include reducing the risk of type 2 diabetes mellitus, heart disease, and stroke. The DASH eating plan may also help with weight loss. WHAT DO I NEED TO KNOW ABOUT THE DASH EATING PLAN? For the DASH eating plan, you will follow these general guidelines: Choose foods with a percent daily value for sodium of less than 5% (as listed on the food label). Use salt-free seasonings or herbs instead of table salt or sea salt. Check with your health care provider or pharmacist before using salt substitutes. Eat lower-sodium products, often labeled as "lower sodium" or "no salt added." Eat fresh foods. Eat more vegetables, fruits, and low-fat dairy products. Choose whole grains. Look for the word "whole" as the first word in the ingredient list. Choose fish  Limit sweets, desserts, sugars, and sugary drinks. Choose heart-healthy fats. Eat veggie cheese  Eat more home-cooked food and less restaurant, buffet, and fast food. Limit fried foods. Cook foods using methods other than frying. Limit  canned vegetables. If you do use them, rinse them well to decrease the sodium. When eating at a restaurant, ask that your food be prepared with less salt, or no salt if possible.                      WHAT FOODS CAN I EAT? Read Dr Fara Olden Fuhrman's books on The End of Dieting & The End of Diabetes  Grains Whole grain or whole wheat bread. Brown rice. Whole grain or whole wheat pasta. Quinoa, bulgur, and whole grain cereals. Low-sodium cereals. Corn or whole wheat flour tortillas. Whole grain cornbread. Whole grain crackers. Low-sodium crackers.  Vegetables Fresh or frozen vegetables (raw, steamed, roasted, or grilled). Low-sodium or reduced-sodium tomato and vegetable juices. Low-sodium or reduced-sodium tomato sauce and paste. Low-sodium or reduced-sodium canned vegetables.   Fruits All fresh, canned (in natural juice), or frozen fruits.  Protein Products  All fish and seafood.  Dried beans, peas, or lentils. Unsalted nuts and seeds. Unsalted canned beans.  Dairy Low-fat dairy products, such as skim or 1% milk, 2% or reduced-fat cheeses, low-fat ricotta or cottage cheese, or plain low-fat yogurt. Low-sodium or reduced-sodium cheeses.  Fats and Oils Tub margarines without trans fats. Light or reduced-fat mayonnaise and salad dressings (reduced sodium). Avocado. Safflower, olive, or canola oils. Natural peanut or almond butter.  Other Unsalted popcorn and pretzels. The items listed above may not be a complete list of recommended foods or beverages. Contact your dietitian for more options.  +++++++++++++++++++  WHAT FOODS ARE NOT RECOMMENDED? Grains/ White flour or wheat flour White bread. White pasta. White rice. Refined cornbread. Bagels and croissants. Crackers that contain trans fat.  Vegetables  Creamed or fried vegetables. Vegetables  in a . Regular canned vegetables. Regular canned tomato sauce and paste. Regular tomato and vegetable juices.  Fruits Dried fruits. Canned fruit  in light or heavy syrup. Fruit juice.  Meat and Other Protein Products Meat in general - RED meat & White meat.  Fatty cuts of meat. Ribs, chicken wings, all processed meats as bacon, sausage, bologna, salami, fatback, hot dogs, bratwurst and packaged luncheon meats.  Dairy Whole or 2% milk, cream, half-and-half, and cream cheese. Whole-fat or sweetened yogurt. Full-fat cheeses or blue cheese. Non-dairy creamers and whipped toppings. Processed cheese, cheese spreads, or cheese curds.  Condiments Onion and garlic salt, seasoned salt, table salt, and sea salt. Canned and packaged gravies. Worcestershire sauce. Tartar sauce. Barbecue sauce. Teriyaki sauce. Soy sauce, including reduced sodium. Steak sauce. Fish sauce. Oyster sauce. Cocktail sauce. Horseradish. Ketchup and mustard. Meat flavorings and tenderizers. Bouillon cubes. Hot sauce. Tabasco sauce. Marinades. Taco seasonings. Relishes.  Fats and Oils Butter, stick margarine, lard, shortening and bacon fat. Coconut, palm kernel, or palm oils. Regular salad dressings.  Pickles and olives. Salted popcorn and pretzels.  The items listed above may not be a complete list of foods and beverages to avoid.   
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
## 2021-05-05 ENCOUNTER — Ambulatory Visit (INDEPENDENT_AMBULATORY_CARE_PROVIDER_SITE_OTHER): Payer: Medicare Other | Admitting: Internal Medicine

## 2021-05-05 ENCOUNTER — Encounter: Payer: Self-pay | Admitting: Internal Medicine

## 2021-05-05 ENCOUNTER — Other Ambulatory Visit: Payer: Self-pay

## 2021-05-05 VITALS — BP 128/80 | HR 60 | Temp 97.3°F | Resp 16 | Ht 67.5 in | Wt 159.2 lb

## 2021-05-05 DIAGNOSIS — Z136 Encounter for screening for cardiovascular disorders: Secondary | ICD-10-CM

## 2021-05-05 DIAGNOSIS — R0989 Other specified symptoms and signs involving the circulatory and respiratory systems: Secondary | ICD-10-CM | POA: Diagnosis not present

## 2021-05-05 DIAGNOSIS — S069X0S Unspecified intracranial injury without loss of consciousness, sequela: Secondary | ICD-10-CM

## 2021-05-05 DIAGNOSIS — Z79899 Other long term (current) drug therapy: Secondary | ICD-10-CM

## 2021-05-05 DIAGNOSIS — Z1212 Encounter for screening for malignant neoplasm of rectum: Secondary | ICD-10-CM

## 2021-05-05 DIAGNOSIS — R7309 Other abnormal glucose: Secondary | ICD-10-CM | POA: Diagnosis not present

## 2021-05-05 DIAGNOSIS — F17200 Nicotine dependence, unspecified, uncomplicated: Secondary | ICD-10-CM

## 2021-05-05 DIAGNOSIS — Z8249 Family history of ischemic heart disease and other diseases of the circulatory system: Secondary | ICD-10-CM

## 2021-05-05 DIAGNOSIS — E559 Vitamin D deficiency, unspecified: Secondary | ICD-10-CM

## 2021-05-05 DIAGNOSIS — E782 Mixed hyperlipidemia: Secondary | ICD-10-CM

## 2021-05-05 DIAGNOSIS — R7303 Prediabetes: Secondary | ICD-10-CM

## 2021-05-05 DIAGNOSIS — Z1211 Encounter for screening for malignant neoplasm of colon: Secondary | ICD-10-CM

## 2021-05-05 DIAGNOSIS — F1021 Alcohol dependence, in remission: Secondary | ICD-10-CM

## 2021-05-06 LAB — MICROALBUMIN / CREATININE URINE RATIO
Creatinine, Urine: 95 mg/dL (ref 20–320)
Microalb Creat Ratio: 5 mcg/mg creat (ref <30)
Microalb, Ur: 0.5 mg/dL

## 2021-05-06 LAB — COMPLETE METABOLIC PANEL WITH GFR
AG Ratio: 2 (calc) (ref 1.0–2.5)
ALT: 14 U/L (ref 9–46)
AST: 21 U/L (ref 10–40)
Albumin: 4.9 g/dL (ref 3.6–5.1)
Alkaline phosphatase (APISO): 45 U/L (ref 36–130)
BUN: 10 mg/dL (ref 7–25)
CO2: 30 mmol/L (ref 20–32)
Calcium: 9.8 mg/dL (ref 8.6–10.3)
Chloride: 102 mmol/L (ref 98–110)
Creat: 0.97 mg/dL (ref 0.60–1.26)
Globulin: 2.5 g/dL (calc) (ref 1.9–3.7)
Glucose, Bld: 106 mg/dL — ABNORMAL HIGH (ref 65–99)
Potassium: 4.4 mmol/L (ref 3.5–5.3)
Sodium: 140 mmol/L (ref 135–146)
Total Bilirubin: 0.8 mg/dL (ref 0.2–1.2)
Total Protein: 7.4 g/dL (ref 6.1–8.1)
eGFR: 102 mL/min/{1.73_m2} (ref >=60)

## 2021-05-06 LAB — CBC WITH DIFFERENTIAL/PLATELET
Absolute Monocytes: 413 cells/uL (ref 200–950)
Basophils Absolute: 102 cells/uL (ref 0–200)
Basophils Relative: 2 %
Eosinophils Absolute: 143 cells/uL (ref 15–500)
Eosinophils Relative: 2.8 %
HCT: 48.8 % (ref 38.5–50.0)
Hemoglobin: 16.5 g/dL (ref 13.2–17.1)
Lymphs Abs: 1244 cells/uL (ref 850–3900)
MCH: 29.8 pg (ref 27.0–33.0)
MCHC: 33.8 g/dL (ref 32.0–36.0)
MCV: 88.1 fL (ref 80.0–100.0)
MPV: 10.7 fL (ref 7.5–12.5)
Monocytes Relative: 8.1 %
Neutro Abs: 3198 cells/uL (ref 1500–7800)
Neutrophils Relative %: 62.7 %
Platelets: 234 10*3/uL (ref 140–400)
RBC: 5.54 10*6/uL (ref 4.20–5.80)
RDW: 12.6 % (ref 11.0–15.0)
Total Lymphocyte: 24.4 %
WBC: 5.1 10*3/uL (ref 3.8–10.8)

## 2021-05-06 LAB — URINALYSIS, ROUTINE W REFLEX MICROSCOPIC
Bacteria, UA: NONE SEEN /HPF
Bilirubin Urine: NEGATIVE
Glucose, UA: NEGATIVE
Hgb urine dipstick: NEGATIVE
Hyaline Cast: NONE SEEN /LPF
Ketones, ur: NEGATIVE
Leukocytes,Ua: NEGATIVE
Nitrite: NEGATIVE
RBC / HPF: NONE SEEN /HPF (ref 0–2)
Specific Gravity, Urine: 1.015 (ref 1.001–1.035)
Squamous Epithelial / HPF: NONE SEEN /HPF (ref <=5)
WBC, UA: NONE SEEN /HPF (ref 0–5)
pH: 8 (ref 5.0–8.0)

## 2021-05-06 LAB — LIPID PANEL
Cholesterol: 175 mg/dL (ref <200)
HDL: 69 mg/dL (ref >=40)
LDL Cholesterol (Calc): 88 mg/dL (calc)
Non-HDL Cholesterol (Calc): 106 mg/dL (calc) (ref <130)
Total CHOL/HDL Ratio: 2.5 (calc) (ref <5.0)
Triglycerides: 88 mg/dL (ref <150)

## 2021-05-06 LAB — VITAMIN D 25 HYDROXY (VIT D DEFICIENCY, FRACTURES): Vit D, 25-Hydroxy: 62 ng/mL (ref 30–100)

## 2021-05-06 LAB — MICROSCOPIC MESSAGE

## 2021-05-06 LAB — TSH: TSH: 2.83 mIU/L (ref 0.40–4.50)

## 2021-05-06 LAB — HEMOGLOBIN A1C
Hgb A1c MFr Bld: 4.9 % of total Hgb (ref <5.7)
Mean Plasma Glucose: 94 mg/dL
eAG (mmol/L): 5.2 mmol/L

## 2021-05-06 LAB — INSULIN, RANDOM: Insulin: 8.5 u[IU]/mL

## 2021-05-06 LAB — MAGNESIUM: Magnesium: 1.8 mg/dL (ref 1.5–2.5)

## 2021-05-06 NOTE — Progress Notes (Signed)
============================================================ -   Test results slightly outside the reference range are not unusual. If there is anything important, I will review this with you,  otherwise it is considered normal test values.  If you have further questions,  please do not hesitate to contact me at the office or via My Chart.  ============================================================ ============================================================  -   Magnesium  -   1.8   -  very  low- goal is betw 2.0 - 2.5,   - So..............Marland Kitchen  Recommend that you take  Magnesium 500 mg tablet daily   - also important to eat lots of  leafy green vegetables   - spinach - Kale - collards - greens - okra - asparagus  - broccoli - quinoa - squash - almonds   - black, red, white beans  -  peas - green beans ============================================================ ============================================================  -  Total Chol = 175   -    Excellent   - Very low risk for Heart Attack  / Stroke ============================================================ ============================================================  -  A1c - Normal - No Diabetes - Great   !  ============================================================ ============================================================  -  Vitamin D = 62 - Excellent  !  ============================================================ ============================================================  -  All Else - CBC - Kidneys - Electrolytes - Liver - Magnesium & Thyroid    - all  Normal / OK ============================================================ ============================================================  -  Keep up the [place] Work  !  ============================================================ ============================================================

## 2021-05-07 ENCOUNTER — Encounter: Payer: Self-pay | Admitting: Internal Medicine
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
## 2021-10-25 ENCOUNTER — Ambulatory Visit (INDEPENDENT_AMBULATORY_CARE_PROVIDER_SITE_OTHER): Payer: Medicare Other | Admitting: Adult Health

## 2021-10-25 ENCOUNTER — Encounter: Payer: Self-pay | Admitting: Adult Health

## 2021-10-25 VITALS — BP 124/88 | HR 87 | Temp 97.9°F | Wt 166.6 lb

## 2021-10-25 DIAGNOSIS — I10 Essential (primary) hypertension: Secondary | ICD-10-CM

## 2021-10-25 DIAGNOSIS — R6889 Other general symptoms and signs: Secondary | ICD-10-CM | POA: Diagnosis not present

## 2021-10-25 DIAGNOSIS — S069X0S Unspecified intracranial injury without loss of consciousness, sequela: Secondary | ICD-10-CM

## 2021-10-25 DIAGNOSIS — E782 Mixed hyperlipidemia: Secondary | ICD-10-CM

## 2021-10-25 DIAGNOSIS — F1021 Alcohol dependence, in remission: Secondary | ICD-10-CM | POA: Diagnosis not present

## 2021-10-25 DIAGNOSIS — E559 Vitamin D deficiency, unspecified: Secondary | ICD-10-CM

## 2021-10-25 DIAGNOSIS — R569 Unspecified convulsions: Secondary | ICD-10-CM

## 2021-10-25 DIAGNOSIS — F3342 Major depressive disorder, recurrent, in full remission: Secondary | ICD-10-CM

## 2021-10-25 DIAGNOSIS — R7309 Other abnormal glucose: Secondary | ICD-10-CM

## 2021-10-25 DIAGNOSIS — Z6824 Body mass index (BMI) 24.0-24.9, adult: Secondary | ICD-10-CM

## 2021-10-25 DIAGNOSIS — F3341 Major depressive disorder, recurrent, in partial remission: Secondary | ICD-10-CM

## 2021-10-25 DIAGNOSIS — B351 Tinea unguium: Secondary | ICD-10-CM

## 2021-10-25 DIAGNOSIS — Z72811 Adult antisocial behavior: Secondary | ICD-10-CM

## 2021-10-25 DIAGNOSIS — Z79899 Other long term (current) drug therapy: Secondary | ICD-10-CM

## 2021-10-25 DIAGNOSIS — L700 Acne vulgaris: Secondary | ICD-10-CM

## 2021-10-25 DIAGNOSIS — Z0001 Encounter for general adult medical examination with abnormal findings: Secondary | ICD-10-CM

## 2021-10-25 DIAGNOSIS — Z0000 Encounter for general adult medical examination without abnormal findings: Secondary | ICD-10-CM

## 2021-10-25 NOTE — Patient Instructions (Addendum)
?Mr. Troy [name] , ?Thank you for taking time to come for your Medicare Wellness Visit. I appreciate your ongoing commitment to your health goals. Please review the following plan we discussed and let me know if I can assist you in the future.  ? ?These are the goals we discussed: ? Goals   ? ?  Blood Pressure < 130/80   ?  DIET - INCREASE WATER INTAKE   ?  Start with 1 bottle of water daily, and gradually increase for goal of 65+ fluid ounces, or 4-5 bottles daily  ?  ?  Exercise 150 min/wk Moderate Activity   ?  Quit Smoking   ? ?  ?  ?This is a list of the screening recommended for you and due dates:  ?Health Maintenance  ?Topic Date Due  ? COVID-19 Vaccine (4 - Booster for Moderna series) 11/10/2021*  ? Flu Shot  02/21/2022  ? Tetanus Vaccine  07/04/2022  ? Hepatitis C Screening: USPSTF Recommendation to screen - Ages 84-79 yo.  Completed  ? HPV Vaccine  Aged Out  ? HIV Screening  Discontinued  ?*Topic was postponed. The date shown is not the original due date.  ? ? ? ?DASH Eating Plan ?DASH stands for Dietary Approaches to Stop Hypertension. The DASH eating plan is a healthy eating plan that has been shown to: ?Reduce high blood pressure (hypertension). ?Reduce your risk for type 2 diabetes, heart disease, and stroke. ?Help with weight loss. ?What are tips for following this plan? ?Reading food labels ?Check food labels for the amount of salt (sodium) per serving. Choose foods with less than 5 percent of the Daily Value of sodium. Generally, foods with less than 300 milligrams (mg) of sodium per serving fit into this eating plan. ?To find whole grains, look for the word "whole" as the first word in the ingredient list. ?Shopping ?Buy products labeled as "low-sodium" or "no salt added." ?Buy fresh foods. Avoid canned foods and pre-made or frozen meals. ?Cooking ?Avoid adding salt when cooking. Use salt-free seasonings or herbs instead of table salt or sea salt. Check with your health care provider or pharmacist  before using salt substitutes. ?Do not fry foods. Cook foods using healthy methods such as baking, boiling, grilling, roasting, and broiling instead. ?Cook with heart-healthy oils, such as olive, canola, avocado, soybean, or sunflower oil. ?Meal planning ? ?Eat a balanced diet that includes: ?4 or more servings of fruits and 4 or more servings of vegetables each day. Try to fill one-half of your plate with fruits and vegetables. ?6-8 servings of whole grains each day. ?Less than 6 oz (170 g) of lean meat, poultry, or fish each day. A 3-oz (85-g) serving of meat is about the same size as a deck of cards. One egg equals 1 oz (28 g). ?2-3 servings of low-fat dairy each day. One serving is 1 cup (237 mL). ?1 serving of nuts, seeds, or beans 5 times each week. ?2-3 servings of heart-healthy fats. Healthy fats called omega-3 fatty acids are found in foods such as walnuts, flaxseeds, fortified milks, and eggs. These fats are also found in cold-water fish, such as sardines, salmon, and mackerel. ?Limit how much you eat of: ?Canned or prepackaged foods. ?Food that is high in trans fat, such as some fried foods. ?Food that is high in saturated fat, such as fatty meat. ?Desserts and other sweets, sugary drinks, and other foods with added sugar. ?Full-fat dairy products. ?Do not salt foods before eating. ?Do not eat  more than 4 egg yolks a week. ?Try to eat at least 2 vegetarian meals a week. ?Eat more home-cooked food and less restaurant, buffet, and fast food. ?Lifestyle ?When eating at a restaurant, ask that your food be prepared with less salt or no salt, if possible. ?If you drink alcohol: ?Limit how much you use to: ?0-1 drink a day for women who are not pregnant. ?0-2 drinks a day for men. ?Be aware of how much alcohol is in your drink. In the [place] one drink equals one 12 oz bottle of beer (355 mL), one 5 oz glass of wine (148 mL), or one 1? oz glass of hard liquor (44 mL). ?General information ?Avoid eating more than  2,300 mg of salt a day. If you have hypertension, you may need to reduce your sodium intake to 1,500 mg a day. ?Work with your health care provider to maintain a healthy body weight or to lose weight. Ask what an ideal weight is for you. ?Get at least 30 minutes of exercise that causes your heart to beat faster (aerobic exercise) most days of the week. Activities may include walking, swimming, or biking. ?Work with your health care provider or dietitian to adjust your eating plan to your individual calorie needs. ?What foods should I eat? ?Fruits ?All fresh, dried, or frozen fruit. Canned fruit in natural juice (without added sugar). ?Vegetables ?Fresh or frozen vegetables (raw, steamed, roasted, or grilled). Low-sodium or reduced-sodium tomato and vegetable juice. Low-sodium or reduced-sodium tomato sauce and tomato paste. Low-sodium or reduced-sodium canned vegetables. ?Grains ?Whole-grain or whole-wheat bread. Whole-grain or whole-wheat pasta. Brown rice. Orpah Cobb. Bulgur. Whole-grain and low-sodium cereals. Pita bread. Low-fat, low-sodium crackers. Whole-wheat flour tortillas. ?Meats and other proteins ?Skinless chicken or [place]. Ground chicken or [place]. Pork with fat trimmed off. Fish and seafood. Egg whites. Dried beans, peas, or lentils. Unsalted nuts, nut butters, and seeds. Unsalted canned beans. Lean cuts of beef with fat trimmed off. Low-sodium, lean precooked or cured meat, such as sausages or meat loaves. ?Dairy ?Low-fat (1%) or fat-free (skim) milk. Reduced-fat, low-fat, or fat-free cheeses. Nonfat, low-sodium ricotta or cottage cheese. Low-fat or nonfat yogurt. Low-fat, low-sodium cheese. ?Fats and oils ?Soft margarine without trans fats. Vegetable oil. Reduced-fat, low-fat, or light mayonnaise and salad dressings (reduced-sodium). Canola, safflower, olive, avocado, soybean, and sunflower oils. Avocado. ?Seasonings and condiments ?Herbs. Spices. Seasoning mixes without salt. ?Other  foods ?Unsalted popcorn and pretzels. Fat-free sweets. ?The items listed above may not be a complete list of foods and beverages you can eat. Contact a dietitian for more information. ?What foods should I avoid? ?Fruits ?Canned fruit in a light or heavy syrup. Fried fruit. Fruit in cream or butter sauce. ?Vegetables ?Creamed or fried vegetables. Vegetables in a cheese sauce. Regular canned vegetables (not low-sodium or reduced-sodium). Regular canned tomato sauce and paste (not low-sodium or reduced-sodium). Regular tomato and vegetable juice (not low-sodium or reduced-sodium). Rosita Fire. Olives. ?Grains ?Baked goods made with fat, such as croissants, muffins, or some breads. Dry pasta or rice meal packs. ?Meats and other proteins ?Fatty cuts of meat. Ribs. Fried meat. Tomasa Blase. Bologna, salami, and other precooked or cured meats, such as sausages or meat loaves. Fat from the back of a pig (fatback). Bratwurst. Salted nuts and seeds. Canned beans with added salt. Canned or smoked fish. Whole eggs or egg yolks. Chicken or [place] with skin. ?Dairy ?Whole or 2% milk, cream, and half-and-half. Whole or full-fat cream cheese. Whole-fat or sweetened yogurt. Full-fat cheese. Nondairy creamers.  Whipped toppings. Processed cheese and cheese spreads. ?Fats and oils ?Butter. Stick margarine. Lard. Shortening. Ghee. Bacon fat. Tropical oils, such as coconut, palm kernel, or palm oil. ?Seasonings and condiments ?Onion salt, garlic salt, seasoned salt, table salt, and sea salt. Worcestershire sauce. Tartar sauce. Barbecue sauce. Teriyaki sauce. Soy sauce, including reduced-sodium. Steak sauce. Canned and packaged gravies. Fish sauce. Oyster sauce. Cocktail sauce. Store-bought horseradish. Ketchup. Mustard. Meat flavorings and tenderizers. Bouillon cubes. Hot sauces. Pre-made or packaged marinades. Pre-made or packaged taco seasonings. Relishes. Regular salad dressings. ?Other foods ?Salted popcorn and pretzels. ?The items listed above  may not be a complete list of foods and beverages you should avoid. Contact a dietitian for more information. ?Where to find more information ?National Heart, Lung, and Blood Institute: Pop[employer].iswww.nhlbi.nih.gov ?[employer]

## 2021-10-26 LAB — CBC WITH DIFFERENTIAL/PLATELET
Absolute Monocytes: 454 cells/uL (ref 200–950)
Basophils Absolute: 61 cells/uL (ref 0–200)
Basophils Relative: 1.2 %
Eosinophils Absolute: 224 cells/uL (ref 15–500)
Eosinophils Relative: 4.4 %
HCT: 47.9 % (ref 38.5–50.0)
Hemoglobin: 16.3 g/dL (ref 13.2–17.1)
Lymphs Abs: 1464 cells/uL (ref 850–3900)
MCH: 30.3 pg (ref 27.0–33.0)
MCHC: 34 g/dL (ref 32.0–36.0)
MCV: 89 fL (ref 80.0–100.0)
MPV: 11.4 fL (ref 7.5–12.5)
Monocytes Relative: 8.9 %
Neutro Abs: 2897 cells/uL (ref 1500–7800)
Neutrophils Relative %: 56.8 %
Platelets: 201 10*3/uL (ref 140–400)
RBC: 5.38 10*6/uL (ref 4.20–5.80)
RDW: 12.6 % (ref 11.0–15.0)
Total Lymphocyte: 28.7 %
WBC: 5.1 10*3/uL (ref 3.8–10.8)

## 2021-10-26 LAB — COMPLETE METABOLIC PANEL WITH GFR
AG Ratio: 1.9 (calc) (ref 1.0–2.5)
ALT: 16 U/L (ref 9–46)
AST: 21 U/L (ref 10–40)
Albumin: 4.8 g/dL (ref 3.6–5.1)
Alkaline phosphatase (APISO): 51 U/L (ref 36–130)
BUN: 10 mg/dL (ref 7–25)
CO2: 29 mmol/L (ref 20–32)
Calcium: 9.7 mg/dL (ref 8.6–10.3)
Chloride: 103 mmol/L (ref 98–110)
Creat: 1.06 mg/dL (ref 0.60–1.29)
Globulin: 2.5 g/dL (calc) (ref 1.9–3.7)
Glucose, Bld: 98 mg/dL (ref 65–99)
Potassium: 4.8 mmol/L (ref 3.5–5.3)
Sodium: 140 mmol/L (ref 135–146)
Total Bilirubin: 0.6 mg/dL (ref 0.2–1.2)
Total Protein: 7.3 g/dL (ref 6.1–8.1)
eGFR: 91 mL/min/{1.73_m2} (ref >=60)

## 2021-10-26 LAB — MAGNESIUM: Magnesium: 1.9 mg/dL (ref 1.5–2.5)

## 2021-10-26 LAB — VITAMIN D 25 HYDROXY (VIT D DEFICIENCY, FRACTURES): Vit D, 25-Hydroxy: 41 ng/mL (ref 30–100)

## 2021-10-26 LAB — LIPID PANEL
Cholesterol: 156 mg/dL (ref <200)
HDL: 66 mg/dL (ref >=40)
LDL Cholesterol (Calc): 73 mg/dL (calc)
Non-HDL Cholesterol (Calc): 90 mg/dL (calc) (ref <130)
Total CHOL/HDL Ratio: 2.4 (calc) (ref <5.0)
Triglycerides: 87 mg/dL (ref <150)

## 2021-10-26 LAB — TSH: TSH: 1.06 mIU/L (ref 0.40–4.50)

## 2022-01-31 ENCOUNTER — Other Ambulatory Visit: Payer: Self-pay | Admitting: Adult Health

## 2022-05-04 ENCOUNTER — Encounter: Payer: Self-pay | Admitting: Internal Medicine
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
## 2022-05-04 NOTE — Patient Instructions (Signed)

## 2022-05-05 ENCOUNTER — Encounter: Payer: Self-pay | Admitting: Internal Medicine

## 2022-05-05 ENCOUNTER — Ambulatory Visit (INDEPENDENT_AMBULATORY_CARE_PROVIDER_SITE_OTHER): Payer: Medicare Other | Admitting: Internal Medicine

## 2022-05-05 VITALS — BP 110/78 | HR 76 | Temp 98.2°F | Resp 16 | Ht 67.5 in | Wt 168.6 lb

## 2022-05-05 DIAGNOSIS — F3342 Major depressive disorder, recurrent, in full remission: Secondary | ICD-10-CM

## 2022-05-05 DIAGNOSIS — E559 Vitamin D deficiency, unspecified: Secondary | ICD-10-CM | POA: Diagnosis not present

## 2022-05-05 DIAGNOSIS — F17200 Nicotine dependence, unspecified, uncomplicated: Secondary | ICD-10-CM

## 2022-05-05 DIAGNOSIS — Z1211 Encounter for screening for malignant neoplasm of colon: Secondary | ICD-10-CM

## 2022-05-05 DIAGNOSIS — R0989 Other specified symptoms and signs involving the circulatory and respiratory systems: Secondary | ICD-10-CM | POA: Diagnosis not present

## 2022-05-05 DIAGNOSIS — E782 Mixed hyperlipidemia: Secondary | ICD-10-CM | POA: Diagnosis not present

## 2022-05-05 DIAGNOSIS — Z136 Encounter for screening for cardiovascular disorders: Secondary | ICD-10-CM | POA: Diagnosis not present

## 2022-05-05 DIAGNOSIS — R7309 Other abnormal glucose: Secondary | ICD-10-CM

## 2022-05-05 DIAGNOSIS — F1021 Alcohol dependence, in remission: Secondary | ICD-10-CM

## 2022-05-05 DIAGNOSIS — Z79899 Other long term (current) drug therapy: Secondary | ICD-10-CM

## 2022-05-05 DIAGNOSIS — Z8249 Family history of ischemic heart disease and other diseases of the circulatory system: Secondary | ICD-10-CM

## 2022-05-05 DIAGNOSIS — Z125 Encounter for screening for malignant neoplasm of prostate: Secondary | ICD-10-CM

## 2022-05-05 DIAGNOSIS — N401 Enlarged prostate with lower urinary tract symptoms: Secondary | ICD-10-CM

## 2022-05-05 DIAGNOSIS — S069X0S Unspecified intracranial injury without loss of consciousness, sequela: Secondary | ICD-10-CM

## 2022-05-05 DIAGNOSIS — R569 Unspecified convulsions: Secondary | ICD-10-CM

## 2022-05-06 NOTE — Progress Notes (Signed)
<><><><><><><><><><><><><><><><><><><><><><><><><><><><><><><><><> <><><><><><><><><><><><><><><><><><><><><><><><><><><><><><><><><> -   Test results slightly outside the reference range are not unusual. If there is anything important, I will review this with you,  otherwise it is considered normal test values.  If you have further questions,  please do not hesitate to contact me at the office or via My Chart.  <><><><><><><><><><><><><><><><><><><><><><><><><><><><><><><><><> <><><><><><><><><><><><><><><><><><><><><><><><><><><><><><><><><>  -  Chol = 152  -     Excellent   - Very low risk for Heart Attack  / Stroke <><><><><><><><><><><><><><><><><><><><><><><><><><><><><><><><><> <><><><><><><><><><><><><><><><><><><><><><><><><><><><><><><><><>  -  A1c   - Normal   - No Diabetes  ! <><><><><><><><><><><><><><><><><><><><><><><><><><><><><><><><><> <><><><><><><><><><><><><><><><><><><><><><><><><><><><><><><><><>  -  Vitamin D = 74   - Excellent - Please keep dose same  <><><><><><><><><><><><><><><><><><><><><><><><><><><><><><><><><> <><><><><><><><><><><><><><><><><><><><><><><><><><><><><><><><><>  -  All Else - CBC - Kidneys - Electrolytes - Liver - Magnesium & Thyroid    - all  Normal / OK <><><><><><><><><><><><><><><><><><><><><><><><><><><><><><><><><> <><><><><><><><><><><><><><><><><><><><><><><><><><><><><><><><><>  -  Keep up the [place] Work   !   <><><><><><><><><><><><><><><><><><><><><><><><><><><><><><><><><> <><><><><><><><><><><><><><><><><><><><><><><><><><><><><><><><><>

## 2022-05-07 ENCOUNTER — Encounter: Payer: Self-pay | Admitting: Internal Medicine

## 2022-05-08 LAB — CBC WITH DIFFERENTIAL/PLATELET
Absolute Monocytes: 454 cells/uL (ref 200–950)
Basophils Absolute: 61 cells/uL (ref 0–200)
Basophils Relative: 1.2 %
Eosinophils Absolute: 153 cells/uL (ref 15–500)
Eosinophils Relative: 3 %
HCT: 47.5 % (ref 38.5–50.0)
Hemoglobin: 16.4 g/dL (ref 13.2–17.1)
Lymphs Abs: 1464 cells/uL (ref 850–3900)
MCH: 30.5 pg (ref 27.0–33.0)
MCHC: 34.5 g/dL (ref 32.0–36.0)
MCV: 88.5 fL (ref 80.0–100.0)
MPV: 11.2 fL (ref 7.5–12.5)
Monocytes Relative: 8.9 %
Neutro Abs: 2968 cells/uL (ref 1500–7800)
Neutrophils Relative %: 58.2 %
Platelets: 203 10*3/uL (ref 140–400)
RBC: 5.37 10*6/uL (ref 4.20–5.80)
RDW: 13 % (ref 11.0–15.0)
Total Lymphocyte: 28.7 %
WBC: 5.1 10*3/uL (ref 3.8–10.8)

## 2022-05-08 LAB — MICROALBUMIN / CREATININE URINE RATIO
Creatinine, Urine: 221 mg/dL (ref 20–320)
Microalb Creat Ratio: 1 mcg/mg creat (ref <30)
Microalb, Ur: 0.3 mg/dL

## 2022-05-08 LAB — LIPID PANEL
Cholesterol: 152 mg/dL (ref <200)
HDL: 66 mg/dL (ref >=40)
LDL Cholesterol (Calc): 67 mg/dL (calc)
Non-HDL Cholesterol (Calc): 86 mg/dL (calc) (ref <130)
Total CHOL/HDL Ratio: 2.3 (calc) (ref <5.0)
Triglycerides: 108 mg/dL (ref <150)

## 2022-05-08 LAB — HEMOGLOBIN A1C
Hgb A1c MFr Bld: 5.3 % of total Hgb (ref <5.7)
Mean Plasma Glucose: 105 mg/dL
eAG (mmol/L): 5.8 mmol/L

## 2022-05-08 LAB — URINALYSIS, ROUTINE W REFLEX MICROSCOPIC
Bilirubin Urine: NEGATIVE
Glucose, UA: NEGATIVE
Hgb urine dipstick: NEGATIVE
Ketones, ur: NEGATIVE
Leukocytes,Ua: NEGATIVE
Nitrite: NEGATIVE
Protein, ur: NEGATIVE
Specific Gravity, Urine: 1.022 (ref 1.001–1.035)
pH: 6 (ref 5.0–8.0)

## 2022-05-08 LAB — COMPLETE METABOLIC PANEL WITH GFR
AG Ratio: 2 (calc) (ref 1.0–2.5)
ALT: 14 U/L (ref 9–46)
AST: 21 U/L (ref 10–40)
Albumin: 4.8 g/dL (ref 3.6–5.1)
Alkaline phosphatase (APISO): 43 U/L (ref 36–130)
BUN: 10 mg/dL (ref 7–25)
CO2: 28 mmol/L (ref 20–32)
Calcium: 9.7 mg/dL (ref 8.6–10.3)
Chloride: 105 mmol/L (ref 98–110)
Creat: 1.03 mg/dL (ref 0.60–1.29)
Globulin: 2.4 g/dL (calc) (ref 1.9–3.7)
Glucose, Bld: 81 mg/dL (ref 65–99)
Potassium: 4.5 mmol/L (ref 3.5–5.3)
Sodium: 142 mmol/L (ref 135–146)
Total Bilirubin: 0.9 mg/dL (ref 0.2–1.2)
Total Protein: 7.2 g/dL (ref 6.1–8.1)
eGFR: 94 mL/min/{1.73_m2} (ref >=60)

## 2022-05-08 LAB — MAGNESIUM: Magnesium: 1.9 mg/dL (ref 1.5–2.5)

## 2022-05-08 LAB — INSULIN, RANDOM: Insulin: 4.6 u[IU]/mL

## 2022-05-08 LAB — TSH: TSH: 1.56 mIU/L (ref 0.40–4.50)

## 2022-05-08 LAB — PSA: PSA: 0.74 ng/mL (ref <=4.00)

## 2022-05-08 LAB — VITAMIN D 25 HYDROXY (VIT D DEFICIENCY, FRACTURES): Vit D, 25-Hydroxy: 74 ng/mL (ref 30–100)

## 2022-10-23 ENCOUNTER — Telehealth: Payer: Self-pay | Admitting: *Deleted

## 2022-10-23 ENCOUNTER — Ambulatory Visit: Payer: Medicare Other | Admitting: Internal Medicine

## 2022-10-23 NOTE — Patient Outreach (Signed)
  Care Coordination   10/23/2022 Name: Troy [name] MRN: [id number] DOB: 09-14-1980   Care Coordination Outreach Attempts:  An unsuccessful telephone outreach was attempted today to offer the patient information about available care coordination services as a benefit of their health plan.   Follow Up Plan:  Additional outreach attempts will be made to offer the patient care coordination information and services.   Encounter Outcome:  No Answer   Care Coordination Interventions:  No, not indicated    Eduard Clos, MSW, [place] Worker [employer] [employer] [facility] [phone]

## 2022-10-26 ENCOUNTER — Ambulatory Visit: Payer: Medicare Other | Admitting: Nurse Practitioner
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
## 2022-10-30 ENCOUNTER — Ambulatory Visit (INDEPENDENT_AMBULATORY_CARE_PROVIDER_SITE_OTHER): Payer: Medicare Other | Admitting: Internal Medicine

## 2022-10-30 ENCOUNTER — Encounter: Payer: Self-pay | Admitting: Internal Medicine

## 2022-10-30 VITALS — BP 138/80 | HR 87 | Temp 98.5°F | Resp 17 | Ht 67.5 in | Wt 171.0 lb

## 2022-10-30 DIAGNOSIS — R5383 Other fatigue: Secondary | ICD-10-CM | POA: Diagnosis not present

## 2022-10-30 DIAGNOSIS — Z79899 Other long term (current) drug therapy: Secondary | ICD-10-CM | POA: Diagnosis not present

## 2022-10-30 DIAGNOSIS — R0989 Other specified symptoms and signs involving the circulatory and respiratory systems: Secondary | ICD-10-CM

## 2022-10-30 DIAGNOSIS — E559 Vitamin D deficiency, unspecified: Secondary | ICD-10-CM | POA: Diagnosis not present

## 2022-10-30 DIAGNOSIS — L639 Alopecia areata, unspecified: Secondary | ICD-10-CM

## 2022-10-30 MED ORDER — DEXAMETHASONE 4 MG PO TABS
ORAL_TABLET | ORAL | 0 refills | Status: DC
Start: 2022-10-30 — End: 2023-02-13

## 2022-10-30 MED ORDER — BETAMETHASONE DIPROPIONATE 0.05 % EX OINT
TOPICAL_OINTMENT | Freq: Two times a day (BID) | CUTANEOUS | 3 refills | Status: DC
Start: 2022-10-30 — End: 2023-11-19

## 2022-10-30 NOTE — Patient Instructions (Signed)
Alopecia Areata    Alopecia areata is a condition that causes hair loss. A person with this condition may lose hair on the scalp in patches. In some cases, a person may lose all the hair on the scalp or all the hair from the face and body. Having this condition can be emotionally difficult, but it is not dangerous. Alopecia areata is an autoimmune disease. This means that your body's defense system (immune system) mistakes normal parts of the body for germs or other things that can make you sick. When you have alopecia areata, the immune system attacks the hair follicles. What are the causes? The cause of this condition is not known. What increases the risk? You are more likely to develop this condition if you have: A family history of alopecia. A family history of another autoimmune disease, including type 1 diabetes and thyroid autoimmune disease. Eczema, asthma, and allergies. Down syndrome. What are the signs or symptoms? The main symptom of this condition is round spots of patchy hair loss on the scalp. The spots may be mildly itchy. Other symptoms include: Short dark hairs in the bald patches that are wider at the top (exclamation point hairs). Dents, white spots, or lines in the fingernails or toenails. Balding and body hair loss. This is rare. Alopecia areata usually develops in childhood, but it can develop at any age. For some people, their hair grows back on its own and hair loss does not happen again. For others, their hair may fall out and grow back in cycles. The hair loss may last many years. How is this diagnosed? This condition is diagnosed based on your symptoms and family history. Your health care provider will also check your scalp skin, teeth, and nails. Your health care provider may refer you to a specialist in hair and skin disorders (dermatologist). You may also have tests, including: A hair pull test. Blood tests or other screening tests to check for autoimmune  diseases, such as thyroid disease or diabetes. Skin biopsy to confirm the diagnosis. A procedure to examine the skin with a lighted magnifying instrument (dermoscopy). How is this treated? There is no cure for alopecia areata. The goals of treatment are to promote the regrowth of hair and prevent the immune system from overreacting. No single treatment is right for all people with alopecia areata. It depends on the type of hair loss you have and how severe it is. Work with your health care provider to find the best treatment for you. Treatment may include: Regular checkups to make sure the condition is not getting worse . This is called watchful waiting. Using steroid creams or pills for 6-8 weeks to stop the immune reaction and help hair to regrow more quickly. Using other medicines on your skin (topical medicines) to change the immune system response and support the hair growth cycle. Steroid injections. Therapy and counseling with a support group or therapist if you are having trouble coping with hair loss. Follow these instructions at home: Medicines Apply topical creams only as told by your health care provider. Take over-the-counter and prescription medicines only as told by your health care provider. General instructions Learn as much as you can about your condition. Consider getting a wig or products to make hair look fuller or to cover bald spots, if you feel uncomfortable with your appearance. Get therapy or counseling if you are having a hard time coping with hair loss. Ask your health care provider to recommend a counselor or support group.  Keep all follow-up visits as told by your health care provider. This is important. Where to find more information National Alopecia Areata Foundation: naaf.org Contact a health care provider if: Your hair loss gets worse, even with treatment. You have new symptoms. You are struggling emotionally. Get help right away if: You have a sudden  worsening of the hair loss. Summary Alopecia areata is an autoimmune condition that makes your body's defense system (immune system) attack the hair follicles. This causes you to lose hair. Having this condition can be emotionally difficult, but it is not dangerous. Treatments may include regular checkups to make sure that the condition is not getting worse, medicines, and steroid injections. This information is not intended to replace advice given to you by your health care provider. Make sure you discuss any questions you have with your health care provider. Document Revised: 09/23/2019 Document Reviewed: 09/23/2019 Elsevier Patient Education  2023 [employer].

## 2022-10-31 LAB — CBC WITH DIFFERENTIAL/PLATELET
Absolute Monocytes: 573 cells/uL (ref 200–950)
Basophils Absolute: 90 cells/uL (ref 0–200)
Basophils Relative: 1.3 %
Eosinophils Absolute: 104 cells/uL (ref 15–500)
Eosinophils Relative: 1.5 %
HCT: 48.5 % (ref 38.5–50.0)
Hemoglobin: 16.7 g/dL (ref 13.2–17.1)
Lymphs Abs: 1704 cells/uL (ref 850–3900)
MCH: 31 pg (ref 27.0–33.0)
MCHC: 34.4 g/dL (ref 32.0–36.0)
MCV: 90.1 fL (ref 80.0–100.0)
MPV: 11 fL (ref 7.5–12.5)
Monocytes Relative: 8.3 %
Neutro Abs: 4430 cells/uL (ref 1500–7800)
Neutrophils Relative %: 64.2 %
Platelets: 243 10*3/uL (ref 140–400)
RBC: 5.38 10*6/uL (ref 4.20–5.80)
RDW: 12.7 % (ref 11.0–15.0)
Total Lymphocyte: 24.7 %
WBC: 6.9 10*3/uL (ref 3.8–10.8)

## 2022-10-31 LAB — COMPLETE METABOLIC PANEL WITH GFR
AG Ratio: 2 (calc) (ref 1.0–2.5)
ALT: 17 U/L (ref 9–46)
AST: 21 U/L (ref 10–40)
Albumin: 4.8 g/dL (ref 3.6–5.1)
Alkaline phosphatase (APISO): 51 U/L (ref 36–130)
BUN: 9 mg/dL (ref 7–25)
CO2: 27 mmol/L (ref 20–32)
Calcium: 9.9 mg/dL (ref 8.6–10.3)
Chloride: 105 mmol/L (ref 98–110)
Creat: 0.92 mg/dL (ref 0.60–1.29)
Globulin: 2.4 g/dL (calc) (ref 1.9–3.7)
Glucose, Bld: 106 mg/dL — ABNORMAL HIGH (ref 65–99)
Potassium: 4.2 mmol/L (ref 3.5–5.3)
Sodium: 142 mmol/L (ref 135–146)
Total Bilirubin: 0.4 mg/dL (ref 0.2–1.2)
Total Protein: 7.2 g/dL (ref 6.1–8.1)
eGFR: 107 mL/min/{1.73_m2} (ref >=60)

## 2022-10-31 LAB — VITAMIN D 25 HYDROXY (VIT D DEFICIENCY, FRACTURES): Vit D, 25-Hydroxy: 60 ng/mL (ref 30–100)

## 2022-10-31 LAB — TESTOSTERONE: Testosterone: 145 ng/dL — ABNORMAL LOW (ref 250–827)

## 2022-10-31 LAB — TSH: TSH: 2.31 mIU/L (ref 0.40–4.50)

## 2022-10-31 NOTE — Progress Notes (Signed)
<><><><><><><><><><><><><><><><><><><><><><><><><><><><><><><><><> <><><><><><><><><><><><><><><><><><><><><><><><><><><><><><><><><> -   Test results slightly outside the reference range are not unusual. If there is anything important, I will review this with you,  otherwise it is considered normal test values.  If you have further questions,  please do not hesitate to contact me at the office or via My Chart.  ^<^<^<^<^<^<^<^<^<^<^<^<^<^<^<^<^<^<^<^<^<^<^<^<^<^<^<^<^<^<^<^<^<^<^<^<^ ^>^>^>^>^>^>^>^>^>^>^>^>^>^>^>^>^>^>^>^>^>^>^>^>^>^>^>^>^>^>^>^>^>^>^>^>^  - Testosterone is low , So recommend take Zinc 50 mg tablet Daily to                                                                     help raise testosterone level naturally  And   - Zinc is also important & good for the skin , hair &nails   ^<^<^<^<^<^<^<^<^<^<^<^<^<^<^<^<^<^<^<^<^<^<^<^<^<^<^<^<^<^<^<^<^<^<^<^<^ ^>^>^>^>^>^>^>^>^>^>^>^>^>^>^>^>^>^>^>^>^>^>^>^>^>^>^>^>^>^>^>^>^>^>^>^>^  - CBC , Vitamin D & Thyroid all Normal & OK   ^<^<^<^<^<^<^<^<^<^<^<^<^<^<^<^<^<^<^<^<^<^<^<^<^<^<^<^<^<^<^<^<^<^<^<^<^ ^>^>^>^>^>^>^>^>^>^>^>^>^>^>^>^>^>^>^>^>^>^>^>^>^>^>^>^>^>^>^>^>^>^>^>^>^

## 2022-12-11 ENCOUNTER — Ambulatory Visit: Payer: Medicare Other | Admitting: Internal Medicine

## 2022-12-25 ENCOUNTER — Encounter: Payer: Self-pay | Admitting: Internal Medicine

## 2022-12-25 ENCOUNTER — Ambulatory Visit (INDEPENDENT_AMBULATORY_CARE_PROVIDER_SITE_OTHER): Payer: Medicare Other | Admitting: Internal Medicine

## 2022-12-25 VITALS — BP 140/100 | HR 93 | Temp 97.9°F | Resp 16 | Ht 67.5 in | Wt 174.0 lb

## 2022-12-25 DIAGNOSIS — R0989 Other specified symptoms and signs involving the circulatory and respiratory systems: Secondary | ICD-10-CM

## 2022-12-25 DIAGNOSIS — F419 Anxiety disorder, unspecified: Secondary | ICD-10-CM | POA: Diagnosis not present

## 2022-12-25 DIAGNOSIS — L639 Alopecia areata, unspecified: Secondary | ICD-10-CM | POA: Diagnosis not present

## 2022-12-25 DIAGNOSIS — S069X0S Unspecified intracranial injury without loss of consciousness, sequela: Secondary | ICD-10-CM

## 2022-12-25 MED ORDER — ESCITALOPRAM OXALATE 20 MG PO TABS
ORAL_TABLET | ORAL | 3 refills | Status: DC
Start: 2022-12-25 — End: 2023-02-13

## 2022-12-25 NOTE — Progress Notes (Signed)
Future Appointments  Date Time Provider Department  12/25/2022  3:30 PM [name], MD [facility]  05/10/2023 10:00 AM [name], MD [facility]    History of Present Illness:     The patient is a very nice 42 y.o. single WM with labile HTN, HLD, Prediabetes, TBI  and Vitamin D Deficiency who was seen  (10/30/2022) with concerns of hair loss in patches which was felt due to Alopecia Areata.  He was prescribed Diprolene ung 2-3 x /day, & given a pulse Decadron taper  over 10 days , tapering to Decadron  4 mg daily for 2 weeks,  then further tapering May 1st  to 4 mg QOD. He's also started intralesional injections at [facility] by Jamse Mead , NP.     Today patient asks for help controlling his libido in that his girlfriend of 7-8 years has broken her relationship with him because of his repeated advances towards other ladies which he feels that he cannot control his impulses.    Current Outpatient Medications  Medication Instructions   betamethasone dipropionate (DIPROLENE) 0.05 % ointment Topical, 2 times daily, Apply to Skin  2 to 3 x /day   Cholecalciferol 2,000 Units, Oral, Daily   dexamethasone (DECADRON) 4 MG tablet Take 1 tab 3 x day for 5 days, then 2 x day for 5  days, then 1 tab daily for 2 weeks,  then 1 tablet every other day   Multiple Vitamin (MULTIVITAMIN) tablet 1 tablet, Oral, Daily, With probiotics, enzymes CoQ10, omega, turmeric, B complex   traZODone (DESYREL) 150 MG tablet TAKE 1 TABLET BY MOUTH AT BEDTIME FOR SLEEP      Allergies  Allergen Reactions   Quinolones Other (See Comments)    Unknown     Problem list He has TBI (traumatic brain injury) (HCC); Major depression in full remission (HCC); History of alcoholism (HCC); Benign labile hypertension; Other abnormal glucose; Mixed hyperlipidemia; Acne; Provoked seizures (HCC); Vitamin D deficiency; Medication management; Adult antisocial behavior; Sexual desire disorder; and  Onychomycosis on their problem list.   Observations/Objective:  BP (!) 140/100   Pulse 93   Temp 97.9 F (36.6 C)   Resp 16   Ht 5' 7.5" (1.715 m)   Wt 174 lb (78.9 kg)   SpO2 98%   BMI 26.85 kg/m    He has a 3 x 4 cm irregular patch of alopecia in the left temporal scalp which appears unchanged & several smaller areas of patchy alopecia.    HEENT - WNL. Neck - supple.  Chest - Clear equal BS. Cor - Nl HS. RRR w/o sig MGR. PP 1(+). No edema. MS- FROM w/o deformities.  Gait Nl. Neuro -  Nl w/o focal abnormalities.   Assessment and Plan:   1. Labile hypertension   2. Alopecia areata   3. Traumatic brain injury (HCC)   4. Chronic anxiety  Try Lexapro to sequester his libido   - escitalopram (LEXAPRO) 20 MG tablet;  Take 1 tablet Daily for Mood & Chronic Anxiety    Dispense: 90 tablet; Refill: 3  - ROV 6 weeks   Follow Up Instructions:        I discussed the assessment and treatment plan with the patient. The patient was provided an opportunity to ask questions and all were answered. The patient agreed with the plan and demonstrated an understanding of the instructions.       The patient was advised to call back or  seek an in-person evaluation if the symptoms worsen or if the condition fails to improve as anticipated.    Marinus Maw, MD

## 2023-02-13 ENCOUNTER — Ambulatory Visit (INDEPENDENT_AMBULATORY_CARE_PROVIDER_SITE_OTHER): Payer: Medicare Other | Admitting: Internal Medicine

## 2023-02-13 ENCOUNTER — Encounter: Payer: Self-pay | Admitting: Internal Medicine

## 2023-02-13 VITALS — BP 104/78 | HR 97 | Temp 97.5°F | Resp 16 | Ht 67.5 in | Wt 172.2 lb

## 2023-02-13 DIAGNOSIS — I10 Essential (primary) hypertension: Secondary | ICD-10-CM

## 2023-02-13 DIAGNOSIS — F419 Anxiety disorder, unspecified: Secondary | ICD-10-CM

## 2023-02-13 DIAGNOSIS — Z79899 Other long term (current) drug therapy: Secondary | ICD-10-CM | POA: Diagnosis not present

## 2023-02-13 MED ORDER — ESCITALOPRAM OXALATE 20 MG PO TABS
ORAL_TABLET | ORAL | 1 refills | Status: DC
Start: 2023-02-13 — End: 2023-07-08

## 2023-02-13 NOTE — Progress Notes (Signed)
     Future Appointments  Date Time Provider Department  02/13/2023  3:30 PM [name], MD [facility]  05/10/2023 10:00 AM [name], MD [facility]    History of Present Illness:     The patient is a very nice 42 y.o. single WM with labile HTN, HLD, Prediabetes, TBI  and Vitamin D Deficiency who returns for 6 week f/u on Lexapro prescribed to aid in controlling his self-proclaimed  & uncontrollable  hyper libido . He reports significant improvement in that he has not had desire to "beat-off (sic)" for the last 2 weeks , which he feels will help re-establish his broken relationship with his ex- girlfriend. He does desire to increase the dose of the Lexapro .     Current Outpatient Medications on File Prior to Visit  Medication Sig   DIPROLENE 0.05 % oint Apply to Skin  2 to 3 x /day   Cholecalciferol2000 u  Take 1 capsule  daily.   escitalopram (LEXAPRO) 20 MG tablet Take 1 tablet Daily for Mood & Chronic Anxiety   Multiple Vitamin Take 1 tablet  daily   traZODone  150 MG tablet TAKE 1 TABLET AT BEDTIME FOR SLEEP     Allergies  Allergen Reactions   Quinolones Other (See Comments)    Unknown     Problem list He has TBI (traumatic brain injury) (HCC); Major depression in full remission (HCC); History of alcoholism (HCC); Benign labile hypertension; Other abnormal glucose; Mixed hyperlipidemia; Acne; Provoked seizures (HCC); Vitamin D deficiency; Medication management; Adult antisocial behavior; Sexual desire disorder; and Onychomycosis on their problem list.   Observations/Objective:  BP 104/78   Pulse 97   Temp (!) 97.5 F (36.4 C)   Resp 16   Ht 5' 7.5" (1.715 m)   Wt 172 lb 3.2 oz (78.1 kg)   SpO2 97%   BMI 26.57 kg/m   HEENT - WNL. Neck - supple.  Chest - Clear equal BS. Cor - Nl HS. RRR w/o sig MGR. PP 1(+). No edema. MS- FROM w/o deformities.  Gait Nl. Neuro -  Nl w/o focal abnormalities. Psyche - no ideations or delusions   Assessment and  Plan:   1. Chronic anxiety  - escitalopram (LEXAPRO) 20 MG tablet;  Take 1 & 1/2  tablets (30 mg)  Daily for Mood & Chronic Anxiety    2. Medication management  - MAGNESIUM ; Take daily  3. Benign labile hypertension  - MAGNESIUM ; Take daily.   Follow Up Instructions:   ROV 6 weeks.     Discussed meds & Side-Effects        I discussed the assessment and treatment plan with the patient. The patient was provided an opportunity to ask questions and all were answered. The patient agreed with the plan and demonstrated an understanding of the instructions.       The patient was advised to call back or seek an in-person evaluation if the symptoms worsen or if the condition fails to improve as anticipated.    Marinus Maw, MD

## 2023-02-13 NOTE — Patient Instructions (Signed)

## 2023-02-14 ENCOUNTER — Other Ambulatory Visit: Payer: Self-pay

## 2023-02-14 MED ORDER — TRAZODONE HCL 150 MG PO TABS: ORAL_TABLET | ORAL | 3 refills | Status: DC

## 2023-02-27 ENCOUNTER — Other Ambulatory Visit: Payer: Self-pay

## 2023-02-27 ENCOUNTER — Emergency Department (HOSPITAL_COMMUNITY)
Admission: EM | Admit: 2023-02-27 | Discharge: 2023-02-27 | Disposition: A | Discharge status: Home / Self Care | Payer: Medicare Other | Attending: Emergency Medicine | Admitting: Emergency Medicine

## 2023-02-27 ENCOUNTER — Encounter (HOSPITAL_COMMUNITY): Payer: Self-pay

## 2023-02-27 ENCOUNTER — Emergency Department (HOSPITAL_COMMUNITY): Payer: Medicare Other

## 2023-02-27 DIAGNOSIS — Y990 Civilian activity done for income or pay: Secondary | ICD-10-CM | POA: Diagnosis not present

## 2023-02-27 DIAGNOSIS — S61217A Laceration without foreign body of left little finger without damage to nail, initial encounter: Secondary | ICD-10-CM | POA: Diagnosis not present

## 2023-02-27 DIAGNOSIS — Z23 Encounter for immunization: Secondary | ICD-10-CM | POA: Insufficient documentation

## 2023-02-27 DIAGNOSIS — W290XXA Contact with powered kitchen appliance, initial encounter: Secondary | ICD-10-CM | POA: Insufficient documentation

## 2023-02-27 DIAGNOSIS — I10 Essential (primary) hypertension: Secondary | ICD-10-CM | POA: Diagnosis not present

## 2023-02-27 MED ORDER — LIDOCAINE HCL (PF) 1 % IJ SOLN
10.0000 mL | Freq: Once | INTRAMUSCULAR | Status: AC
  Administered 2023-02-27: 10 mL
  Filled 2023-02-27: qty 30

## 2023-02-27 MED ORDER — ACETAMINOPHEN 500 MG PO TABS
1000.0000 mg | ORAL_TABLET | Freq: Once | ORAL | Status: AC
  Administered 2023-02-27: 1000 mg via ORAL
  Filled 2023-02-27: qty 2

## 2023-02-27 MED ORDER — BACITRACIN ZINC 500 UNIT/GM EX OINT
TOPICAL_OINTMENT | CUTANEOUS | Status: AC
  Administered 2023-02-27: 1
  Filled 2023-02-27: qty 0.9

## 2023-02-27 MED ORDER — TETANUS-DIPHTH-ACELL PERTUSSIS 5-2.5-18.5 LF-MCG/0.5 IM SUSY
0.5000 mL | PREFILLED_SYRINGE | Freq: Once | INTRAMUSCULAR | Status: AC
  Administered 2023-02-27: 0.5 mL via INTRAMUSCULAR
  Filled 2023-02-27: qty 0.5

## 2023-02-27 MED ORDER — BACITRACIN ZINC 500 UNIT/GM EX OINT: 1.0000 | TOPICAL_OINTMENT | Freq: Two times a day (BID) | CUTANEOUS | 0 refills | Status: DC

## 2023-02-27 NOTE — Discharge Instructions (Addendum)
Please use bacitracin ointment 2 times daily as prescribed. Take tylenol/ibuprofen for pain. I recommend close follow-up with an urgent care in 10 days for suture removal and reevaluation.  Please do not hesitate to return to emergency department if worrisome signs symptoms we discussed become apparent.
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
## 2023-02-27 NOTE — ED Triage Notes (Signed)
Patient reports his left 5th finger was caught in the slicer at work. Finger has laceration on, patient still able to feel the finger tips. Bleeding controlled at time of triage. Patient does not know when his last tetanus shot was.

## 2023-03-06 ENCOUNTER — Telehealth: Payer: Self-pay | Admitting: *Deleted

## 2023-03-06 NOTE — Telephone Encounter (Signed)
Transition Care Management Unsuccessful Follow-up Telephone Call  Date of discharge and from where:  [name]  02/27/2023  Attempts:  1st Attempt  Reason for unsuccessful TCM follow-up call:  No answer/busy

## 2023-03-07 ENCOUNTER — Telehealth: Payer: Self-pay | Admitting: *Deleted

## 2023-03-07 NOTE — Telephone Encounter (Signed)
Transition Care Management Unsuccessful Follow-up Telephone Call  Date of discharge and from where:  [facility] 02/27/2023  Attempts:  2nd Attempt  Reason for unsuccessful TCM follow-up call:  No answer/busy

## 2023-03-29 ENCOUNTER — Encounter: Payer: Self-pay | Admitting: Internal Medicine

## 2023-03-29 ENCOUNTER — Ambulatory Visit (INDEPENDENT_AMBULATORY_CARE_PROVIDER_SITE_OTHER): Payer: Medicare Other | Admitting: Internal Medicine

## 2023-03-29 VITALS — BP 114/88 | HR 91 | Temp 97.9°F | Resp 16 | Ht 67.5 in | Wt 173.0 lb

## 2023-03-29 DIAGNOSIS — F419 Anxiety disorder, unspecified: Secondary | ICD-10-CM | POA: Diagnosis not present

## 2023-03-29 NOTE — Progress Notes (Signed)
     Future Appointments  Date Time Provider Department  03/29/2023  3:30 PM [name], MD [facility]  05/10/2023                        [facility] 10:00 AM [name], MD [facility]    History of Present Illness:     The patient is a very nice 42 y.o. single WM with labile HTN, HLD, Prediabetes, TBI  and Vitamin D Deficiency who returns for 6 week f/u on Lexapro . Patient had apparently stopped his Lexapro for unclear reasons. He still admits some obsessive thoughts , but feels that he is able to control his compulsions . Denies any SI, HI or harmful compulsions.    Current Outpatient Medications on File Prior to Visit  Medication Sig   bacitracin ointment Apply 1 Application topically 2 (two) times daily.   betamethasone dipropionate (DIPROLENE) 0.05 % ointment Apply topically 2 (two) times daily. Apply to Skin  2 to 3 x /day   Cholecalciferol 50 MCG (2000 UT) CAPS Take 1 capsule (2,000 Units total) by mouth daily.   escitalopram (LEXAPRO) 20 MG tablet Take 1 & 1/2  tablets (30 mg)  Daily for Mood & Chronic Anxiety   MAGNESIUM PO Take by mouth.   Multiple Vitamin (MULTIVITAMIN) tablet Take 1 tablet by mouth daily. With probiotics, enzymes CoQ10, omega, turmeric, B complex   traZODone (DESYREL) 150 MG tablet TAKE 1 TABLET BY MOUTH AT BEDTIME FOR SLEEP     Allergies  Allergen Reactions   Quinolones Other (See Comments)    Unknown     Problem list He has TBI (traumatic brain injury) (HCC); Major depression in full remission (HCC); History of alcoholism (HCC); Benign labile hypertension; Other abnormal glucose; Mixed hyperlipidemia; Acne; Provoked seizures (HCC); Vitamin D deficiency; Medication management; Adult antisocial behavior; Sexual desire disorder; and Onychomycosis on their problem list.   Observations/Objective:  BP 114/88   Pulse 91   Temp 97.9 F (36.6 C)   Resp 16   Ht 5' 7.5" (1.715 m)   Wt 173 lb (78.5 kg)   SpO2 95%   BMI 26.70 kg/m   HEENT -  WNL. Neck - supple.  Chest - Clear equal BS. Cor - Nl HS. RRR w/o sig MGR. PP 1(+). No edema. MS- FROM w/o deformities.  Gait Nl. Neuro -  Nl w/o focal abnormalities.   Assessment and Plan:   1. Chronic anxiety  - Advised patient to restart his Lexapro & he is agreeable.    Follow Up Instructions:        I discussed the assessment and treatment plan with the patient. The patient was provided an opportunity to ask questions and all were answered. The patient agreed with the plan and demonstrated an understanding of the instructions.  Over 20 minutes of counseling,  chart review and critical decision making was performed .        The patient was advised to call back or seek an in-person evaluation if the symptoms worsen or if the condition fails to improve as anticipated.    Marinus Maw, MD

## 2023-05-10 ENCOUNTER — Encounter: Payer: Self-pay | Admitting: Internal Medicine

## 2023-05-10 ENCOUNTER — Ambulatory Visit: Payer: Medicare Other | Admitting: Internal Medicine
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
## 2023-05-10 NOTE — Patient Instructions (Signed)
Due to recent changes in healthcare laws, you may see the results of your imaging and laboratory studies on MyChart before your provider has had a chance to review them.  We understand that in some cases there may be results that are confusing or concerning to you. Not all laboratory results come back in the same time frame and the provider may be waiting for multiple results in order to interpret others.  Please give [place] 48 hours in order for your provider to thoroughly review all the results before contacting the office for clarification of your results.   ++++++++++++++++++++++++++++++++++++++  Vit D  & Vit C 1,000 mg   are recommended to help protect  against the Covid-19 and other Corona viruses.    Also it's recommended  to take  Zinc 50 mg  to help  protect against the Covid-19   and best place to get  is also on [email or web address]  and don't pay more than 6-8 cents /pill !  =============================== Coronavirus (COVID-19) Are you at risk?  Are you at risk for the Coronavirus (COVID-19)?  To be considered HIGH RISK for Coronavirus (COVID-19), you have to meet the following criteria:  Traveled to [place], [place], [place], [place] or [place]; or in the [place] to [place], [place], [place]  or [place]; and have fever, cough, and shortness of breath within the last 2 weeks of travel OR Been in close contact with a person diagnosed with COVID-19 within the last 2 weeks and have  fever, cough,and shortness of breath  IF YOU DO NOT MEET THESE CRITERIA, YOU ARE CONSIDERED LOW RISK FOR COVID-19.  What to do if you are HIGH RISK for COVID-19?  If you are having a medical emergency, call 911. Seek medical care right away. Before you go to a doctor's office, urgent care or emergency department,  call ahead and tell them about your recent travel, contact with someone diagnosed with COVID-19   and your symptoms.  You should receive instructions from your physician's office  regarding next steps of care.  When you arrive at healthcare provider, tell the healthcare staff immediately you have returned from  visiting [place], [place], [place], [place] or [place]; or traveled in the [place] to [place], [place],  [place] or [place] in the last two weeks or you have been in close contact with a person diagnosed with  COVID-19 in the last 2 weeks.   Tell the health care staff about your symptoms: fever, cough and shortness of breath. After you have been seen by a medical provider, you will be either: Tested for (COVID-19) and discharged home on quarantine except to seek medical care if  symptoms worsen, and asked to  Stay home and avoid contact with others until you get your results (4-5 days)  Avoid travel on public transportation if possible (such as bus, train, or airplane) or Sent to the Emergency Department by EMS for evaluation, COVID-19 testing  and  possible admission depending on your condition and test results.  What to do if you are LOW RISK for COVID-19?  Reduce your risk of any infection by using the same precautions used for avoiding the common cold or flu:  Wash your hands often with soap and warm water for at least 20 seconds.  If soap and water are not readily available,  use an alcohol-based hand sanitizer with at least 60% alcohol.  If coughing or sneezing, cover your mouth and nose by coughing  or sneezing into the elbow areas of your shirt or coat,  into a tissue or into your sleeve (not your hands). Avoid shaking hands with others and consider head nods or verbal greetings only. Avoid touching your eyes, nose, or mouth with unwashed hands.  Avoid close contact with people who are sick. Avoid places or events with large numbers of people in one location, like concerts or sporting events. Carefully consider travel plans you have or are making. If you are planning any travel outside or inside the [place], visit the CDC's Travelers' Health  webpage for the latest health notices. If you have some symptoms but not all symptoms, continue to monitor at home and seek medical attention  if your symptoms worsen. If you are having a medical emergency, call 911. >>>>>>>>>>>>>>>>>>>>>>>>>>>> Preventive Care for Adults  A healthy lifestyle and preventive care can promote health and wellness. Preventive health guidelines for men include the following key practices: A routine yearly physical is a good way to check with your health care provider about your health and preventative screening. It is a chance to share any concerns and updates on your health and to receive a thorough exam. Visit your dentist for a routine exam and preventative care every 6 months. Brush your teeth twice a day and floss once a day. Good oral hygiene prevents tooth decay and gum disease. The frequency of eye exams is based on your age, health, family medical history, use of contact lenses, and other factors. Follow your health care provider's recommendations for frequency of eye exams. Eat a healthy diet. Foods such as vegetables, fruits, whole grains, low-fat dairy products, and lean protein foods contain the nutrients you need without too many calories. Decrease your intake of foods high in solid fats, added sugars, and salt. Eat the right amount of calories for you. Get information about a proper diet from your health care provider, if necessary. Regular physical exercise is one of the most important things you can do for your health. Most adults should get at least 150 minutes of moderate-intensity exercise (any activity that increases your heart rate and causes you to sweat) each week. In addition, most adults need muscle-strengthening exercises on 2 or more days a week. Maintain a healthy weight. The body mass index (BMI) is a screening tool to identify possible weight problems. It provides an estimate of body fat based on height and weight. Your health care provider can  find your BMI and can help you achieve or maintain a healthy weight. For adults 20 years and older: A BMI below 18.5 is considered underweight. A BMI of 18.5 to 24.9 is normal. A BMI of 25 to 29.9 is considered overweight. A BMI of 30 and above is considered obese. Maintain normal blood lipids and cholesterol levels by exercising and minimizing your intake of saturated fat. Eat a balanced diet with plenty of fruit and vegetables. Blood tests for lipids and cholesterol should begin at age 42 and be repeated every 5 years. If your lipid or cholesterol levels are high, you are over 50, or you are at high risk for heart disease, you may need your cholesterol levels checked more frequently. Ongoing high lipid and cholesterol levels should be treated with medicines if diet and exercise are not working. If you smoke, find out from your health care provider how to quit. If you do not use tobacco, do not start. Lung cancer screening is recommended for adults aged 55-80 years who are at high risk for  developing lung cancer because of a history of smoking. A yearly low-dose CT scan of the lungs is recommended for people who have at least a 30-pack-year history of smoking and are a current smoker or have quit within the past 15 years. A pack year of smoking is smoking an average of 1 pack of cigarettes a day for 1 year (for example: 1 pack a day for 30 years or 2 packs a day for 15 years). Yearly screening should continue until the smoker has stopped smoking for at least 15 years. Yearly screening should be stopped for people who develop a health problem that would prevent them from having lung cancer treatment. If you choose to drink alcohol, do not have more than 2 drinks per day. One drink is considered to be 12 ounces (355 mL) of beer, 5 ounces (148 mL) of wine, or 1.5 ounces (44 mL) of liquor. Avoid use of street drugs. Do not share needles with anyone. Ask for help if you need support or instructions about  stopping the use of drugs. High blood pressure causes heart disease and increases the risk of stroke. Your blood pressure should be checked at least every 1-2 years. Ongoing high blood pressure should be treated with medicines, if weight loss and exercise are not effective. If you are 58-56 years old, ask your health care provider if you should take aspirin to prevent heart disease. Diabetes screening involves taking a blood sample to check your fasting blood sugar level. This should be done once every 3 years, after age 52, if you are within normal weight and without risk factors for diabetes. Testing should be considered at a younger age or be carried out more frequently if you are overweight and have at least 1 risk factor for diabetes. Colorectal cancer can be detected and often prevented. Most routine colorectal cancer screening begins at the age of 59 and continues through age 29. However, your health care provider may recommend screening at an earlier age if you have risk factors for colon cancer. On a yearly basis, your health care provider may provide home test kits to check for hidden blood in the stool. Use of a small camera at the end of a tube to directly examine the colon (sigmoidoscopy or colonoscopy) can detect the earliest forms of colorectal cancer. Talk to your health care provider about this at age 30, when routine screening begins. Direct exam of the colon should be repeated every 5-10 years through age 31, unless early forms of precancerous polyps or small growths are found.  Talk with your health care provider about prostate cancer screening. Testicular cancer screening isrecommended for adult males. Screening includes self-exam, a health care provider exam, and other screening tests. Consult with your health care provider about any symptoms you have or any concerns you have about testicular cancer. Use sunscreen. Apply sunscreen liberally and repeatedly throughout the day. You should  seek shade when your shadow is shorter than you. Protect yourself by wearing long sleeves, pants, a wide-brimmed hat, and sunglasses year round, whenever you are outdoors. Once a month, do a whole-body skin exam, using a mirror to look at the skin on your back. Tell your health care provider about new moles, moles that have irregular borders, moles that are larger than a pencil eraser, or moles that have changed in shape or color. Stay current with required vaccines (immunizations). Influenza vaccine. All adults should be immunized every year. Tetanus, diphtheria, and acellular pertussis (Td, Tdap) vaccine. An  adult who has not previously received Tdap or who does not know his vaccine status should receive 1 dose of Tdap. This initial dose should be followed by tetanus and diphtheria toxoids (Td) booster doses every 10 years. Adults with an unknown or incomplete history of completing a 3-dose immunization series with Td-containing vaccines should begin or complete a primary immunization series including a Tdap dose. Adults should receive a Td booster every 10 years. Varicella vaccine. An adult without evidence of immunity to varicella should receive 2 doses or a second dose if he has previously received 1 dose. Human papillomavirus (HPV) vaccine. Males aged 13-21 years who have not received the vaccine previously should receive the 3-dose series. Males aged 22-26 years may be immunized. Immunization is recommended through the age of 70 years for any male who has sex with males and did not get any or all doses earlier. Immunization is recommended for any person with an immunocompromised condition through the age of 26 years if he did not get any or all doses earlier. During the 3-dose series, the second dose should be obtained 4-8 weeks after the first dose. The third dose should be obtained 24 weeks after the first dose and 16 weeks after the second dose. Zoster vaccine. One dose is recommended for adults  aged 19 years or older unless certain conditions are present.  PREVNAR  - Pneumococcal 13-valent conjugate (PCV13) vaccine. When indicated, a person who is uncertain of his immunization history and has no record of immunization should receive the PCV13 vaccine. An adult aged 31 years or older who has certain medical conditions and has not been previously immunized should receive 1 dose of PCV13 vaccine. This PCV13 should be followed with a dose of pneumococcal polysaccharide (PPSV23) vaccine. The PPSV23 vaccine dose should be obtained at least 1 r more year(s) after the dose of PCV13 vaccine. An adult aged 79 years or older who has certain medical conditions and previously received 1 or more doses of PPSV23 vaccine should receive 1 dose of PCV13. The PCV13 vaccine dose should be obtained 1 or more years after the last PPSV23 vaccine dose.  PNEUMOVAX - Pneumococcal polysaccharide (PPSV23) vaccine. When PCV13 is also indicated, PCV13 should be obtained first. All adults aged 62 years and older should be immunized. An adult younger than age 75 years who has certain medical conditions should be immunized. Any person who resides in a nursing home or long-term care facility should be immunized. An adult smoker should be immunized. People with an immunocompromised condition and certain other conditions should receive both PCV13 and PPSV23 vaccines. People with human immunodeficiency virus (HIV) infection should be immunized as soon as possible after diagnosis. Immunization during chemotherapy or radiation therapy should be avoided. Routine use of PPSV23 vaccine is not recommended for American Indians, [address], or people younger than 65 years unless there are medical conditions that require PPSV23 vaccine. When indicated, people who have unknown immunization and have no record of immunization should receive PPSV23 vaccine. One-time revaccination 5 years after the first dose of PPSV23 is recommended for people  aged 19-64 years who have chronic kidney failure, nephrotic syndrome, asplenia, or immunocompromised conditions. People who received 1-2 doses of PPSV23 before age 27 years should receive another dose of PPSV23 vaccine at age 44 years or later if at least 5 years have passed since the previous dose. Doses of PPSV23 are not needed for people immunized with PPSV23 at or after age 46 years.  Hepatitis A vaccine.  Adults who wish to be protected from this disease, have certain high-risk conditions, work with hepatitis A-infected animals, work in hepatitis A research labs, or travel to or work in countries with a high rate of hepatitis A should be immunized. Adults who were previously unvaccinated and who anticipate close contact with an international adoptee during the first 60 days after arrival in the [place] from a country with a high rate of hepatitis A should be immunized.  Hepatitis B vaccine. Adults should be immunized if they wish to be protected from this disease, have certain high-risk conditions, may be exposed to blood or other infectious body fluids, are household contacts or sex partners of hepatitis B positive people, are clients or workers in certain care facilities, or travel to or work in countries with a high rate of hepatitis B.  Preventive Service / Frequency  Ages 7 to 92 Blood pressure check. Lipid and cholesterol check Lung cancer screening. / Every year if you are aged 55-80 years and have a 30-pack-year history of smoking and currently smoke or have quit within the past 15 years. Yearly screening is stopped once you have quit smoking for at least 15 years or develop a health problem that would prevent you from having lung cancer treatment. Fecal occult blood test (FOBT) of stool. / Every year beginning at age 74 and continuing until age 29. You may not have to do this test if you get a colonoscopy every 10 years. Flexible sigmoidoscopy** or colonoscopy.** / Every 5 years for  a flexible sigmoidoscopy or every 10 years for a colonoscopy beginning at age 25 and continuing until age 46. Screening for abdominal aortic aneurysm (AAA)  by ultrasound is recommended for people who have history of high blood pressure or who are current or former smokers. +++++++++++ Recommend Adult Low Dose Aspirin or  coated  Aspirin 81 mg daily  To reduce risk of Colon Cancer 40 %,  Skin Cancer 26 % ,  Malignant Melanoma 46%  and  Pancreatic cancer 60% ++++++++++++++++++++ Vitamin D goal  is between 70-100.  Please make sure that you are taking your Vitamin D as directed.  It is very important as a natural anti-inflammatory  helping hair, skin, and nails, as well as reducing stroke and heart attack risk.  It helps your bones and helps with mood. It also decreases numerous cancer risks so please take it as directed.  Low Vit D is associated with a 200-300% higher risk for CANCER  and 200-300% higher risk for HEART   ATTACK  &  STROKE.   .....................................Marland Kitchen It is also associated with higher death rate at younger ages,  autoimmune diseases like Rheumatoid arthritis, Lupus, Multiple Sclerosis.    Also many other serious conditions, like depression, Alzheimer's Dementia, infertility, muscle aches, fatigue, fibromyalgia - just to name a few. +++++++++++++++++++++ Recommend the book "The END of DIETING" by Dr Monico Hoar  & the book "The END of DIABETES " by Dr Monico Hoar At [email or web address] - get book & Audio CD's    Being diabetic has a  300% increased risk for heart attack, stroke, cancer, and alzheimer- type vascular dementia. It is very important that you work harder with diet by avoiding all foods that are white. Avoid white rice (brown & wild rice is OK), white potatoes (sweetpotatoes in moderation is OK), White bread or wheat bread or anything made out of white flour like bagels, donuts, rolls, buns, biscuits, cakes, pastries, cookies, pizza crust, and pasta (made  from white flour & egg whites) - vegetarian pasta or spinach or wheat pasta is OK. Multigrain breads like Arnold's or [employer], or multigrain sandwich thins or flatbreads.  Diet, exercise and weight loss can reverse and cure diabetes in the early stages.  Diet, exercise and weight loss is very important in the control and prevention of complications of diabetes which affects every system in your body, ie. Brain - dementia/stroke, eyes - glaucoma/blindness, heart - heart attack/heart failure, kidneys - dialysis, stomach - gastric paralysis, intestines - malabsorption, nerves - severe painful neuritis, circulation - gangrene & loss of a leg(s), and finally cancer and Alzheimers.    I recommend avoid fried & greasy foods,  sweets/candy, white rice (brown or wild rice or Quinoa is OK), white potatoes (sweet potatoes are OK) - anything made from white flour - bagels, doughnuts, rolls, buns, biscuits,white and wheat breads, pizza crust and traditional pasta made of white flour & egg white(vegetarian pasta or spinach or wheat pasta is OK).  Multi-grain bread is OK - like multi-grain flat bread or sandwich thins. Avoid alcohol in excess. Exercise is also important.    Eat all the vegetables you want - avoid meat, especially red meat and dairy - especially cheese.  Cheese is the most concentrated form of trans-fats which is the worst thing to clog up our arteries. Veggie cheese is OK which can be found in the fresh produce section at [employer] or [employer] or [employer]  ++++++++++++++++++++++ DASH Eating Plan  DASH stands for "Dietary Approaches to Stop Hypertension."   The DASH eating plan is a healthy eating plan that has been shown to reduce high blood pressure (hypertension). Additional health benefits may include reducing the risk of type 2 diabetes mellitus, heart disease, and stroke. The DASH eating plan may also help with weight loss. WHAT DO I NEED TO KNOW ABOUT THE DASH EATING PLAN? For  the DASH eating plan, you will follow these general guidelines: Choose foods with a percent daily value for sodium of less than 5% (as listed on the food label). Use salt-free seasonings or herbs instead of table salt or sea salt. Check with your health care provider or pharmacist before using salt substitutes. Eat lower-sodium products, often labeled as "lower sodium" or "no salt added." Eat fresh foods. Eat more vegetables, fruits, and low-fat dairy products. Choose whole grains. Look for the word "whole" as the first word in the ingredient list. Choose fish  Limit sweets, desserts, sugars, and sugary drinks. Choose heart-healthy fats. Eat veggie cheese  Eat more home-cooked food and less restaurant, buffet, and fast food. Limit fried foods. Cook foods using methods other than frying. Limit canned vegetables. If you do use them, rinse them well to decrease the sodium. When eating at a restaurant, ask that your food be prepared with less salt, or no salt if possible.                      WHAT FOODS CAN I EAT? Read Dr Francis Dowse Fuhrman's books on The End of Dieting & The End of Diabetes  Grains Whole grain or whole wheat bread. Brown rice. Whole grain or whole wheat pasta. Quinoa, bulgur, and whole grain cereals. Low-sodium cereals. Corn or whole wheat flour tortillas. Whole grain cornbread. Whole grain crackers. Low-sodium crackers.  Vegetables Fresh or frozen vegetables (raw, steamed, roasted, or grilled). Low-sodium or reduced-sodium tomato and vegetable juices. Low-sodium or reduced-sodium tomato sauce and paste. Low-sodium or reduced-sodium canned vegetables.  Fruits All fresh, canned (in natural juice), or frozen fruits.  Protein Products  All fish and seafood.  Dried beans, peas, or lentils. Unsalted nuts and seeds. Unsalted canned beans.  Dairy Low-fat dairy products, such as skim or 1% milk, 2% or reduced-fat cheeses, low-fat ricotta or cottage cheese, or plain low-fat yogurt.  Low-sodium or reduced-sodium cheeses.  Fats and Oils Tub margarines without trans fats. Light or reduced-fat mayonnaise and salad dressings (reduced sodium). Avocado. Safflower, olive, or canola oils. Natural peanut or almond butter.  Other Unsalted popcorn and pretzels. The items listed above may not be a complete list of recommended foods or beverages. Contact your dietitian for more options.  +++++++++++++++++++  WHAT FOODS ARE NOT RECOMMENDED? Grains/ White flour or wheat flour White bread. White pasta. White rice. Refined cornbread. Bagels and croissants. Crackers that contain trans fat.  Vegetables  Creamed or fried vegetables. Vegetables in a . Regular canned vegetables. Regular canned tomato sauce and paste. Regular tomato and vegetable juices.  Fruits Dried fruits. Canned fruit in light or heavy syrup. Fruit juice.  Meat and Other Protein Products Meat in general - RED meat & White meat.  Fatty cuts of meat. Ribs, chicken wings, all processed meats as bacon, sausage, bologna, salami, fatback, hot dogs, bratwurst and packaged luncheon meats.  Dairy Whole or 2% milk, cream, half-and-half, and cream cheese. Whole-fat or sweetened yogurt. Full-fat cheeses or blue cheese. Non-dairy creamers and whipped toppings. Processed cheese, cheese spreads, or cheese curds.  Condiments Onion and garlic salt, seasoned salt, table salt, and sea salt. Canned and packaged gravies. Worcestershire sauce. Tartar sauce. Barbecue sauce. Teriyaki sauce. Soy sauce, including reduced sodium. Steak sauce. Fish sauce. Oyster sauce. Cocktail sauce. Horseradish. Ketchup and mustard. Meat flavorings and tenderizers. Bouillon cubes. Hot sauce. Tabasco sauce. Marinades. Taco seasonings. Relishes.  Fats and Oils Butter, stick margarine, lard, shortening and bacon fat. Coconut, palm kernel, or palm oils. Regular salad dressings.  Pickles and olives. Salted popcorn and pretzels.  The items listed above may not  be a complete list of foods and beverages to avoid.
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
## 2023-05-23 ENCOUNTER — Encounter: Payer: Medicare Other | Admitting: Nurse Practitioner

## 2023-05-23 DIAGNOSIS — R7309 Other abnormal glucose: Secondary | ICD-10-CM

## 2023-05-23 DIAGNOSIS — E559 Vitamin D deficiency, unspecified: Secondary | ICD-10-CM

## 2023-05-23 DIAGNOSIS — F3342 Major depressive disorder, recurrent, in full remission: Secondary | ICD-10-CM

## 2023-05-23 DIAGNOSIS — Z79899 Other long term (current) drug therapy: Secondary | ICD-10-CM

## 2023-05-23 DIAGNOSIS — E782 Mixed hyperlipidemia: Secondary | ICD-10-CM

## 2023-05-23 DIAGNOSIS — F1021 Alcohol dependence, in remission: Secondary | ICD-10-CM

## 2023-05-23 DIAGNOSIS — F17200 Nicotine dependence, unspecified, uncomplicated: Secondary | ICD-10-CM

## 2023-05-23 DIAGNOSIS — R0989 Other specified symptoms and signs involving the circulatory and respiratory systems: Secondary | ICD-10-CM

## 2023-05-23 DIAGNOSIS — R569 Unspecified convulsions: Secondary | ICD-10-CM

## 2023-05-23 DIAGNOSIS — E663 Overweight: Secondary | ICD-10-CM

## 2023-05-23 DIAGNOSIS — S069X0S Unspecified intracranial injury without loss of consciousness, sequela: Secondary | ICD-10-CM

## 2023-06-29 ENCOUNTER — Encounter: Payer: Medicare Other | Admitting: Internal Medicine

## 2023-07-08 ENCOUNTER — Encounter: Payer: Self-pay | Admitting: Internal Medicine
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
## 2023-07-08 NOTE — Patient Instructions (Signed)

## 2023-07-09 ENCOUNTER — Encounter: Payer: Self-pay | Admitting: Internal Medicine

## 2023-07-09 ENCOUNTER — Ambulatory Visit (INDEPENDENT_AMBULATORY_CARE_PROVIDER_SITE_OTHER): Payer: Medicare Other | Admitting: Internal Medicine

## 2023-07-09 VITALS — BP 128/88 | HR 87 | Temp 98.1°F | Resp 16 | Ht 67.5 in | Wt 173.6 lb

## 2023-07-09 DIAGNOSIS — F3342 Major depressive disorder, recurrent, in full remission: Secondary | ICD-10-CM

## 2023-07-09 DIAGNOSIS — N138 Other obstructive and reflux uropathy: Secondary | ICD-10-CM

## 2023-07-09 DIAGNOSIS — Z136 Encounter for screening for cardiovascular disorders: Secondary | ICD-10-CM | POA: Diagnosis not present

## 2023-07-09 DIAGNOSIS — R569 Unspecified convulsions: Secondary | ICD-10-CM

## 2023-07-09 DIAGNOSIS — R0989 Other specified symptoms and signs involving the circulatory and respiratory systems: Secondary | ICD-10-CM | POA: Diagnosis not present

## 2023-07-09 DIAGNOSIS — E559 Vitamin D deficiency, unspecified: Secondary | ICD-10-CM

## 2023-07-09 DIAGNOSIS — N401 Enlarged prostate with lower urinary tract symptoms: Secondary | ICD-10-CM

## 2023-07-09 DIAGNOSIS — F17200 Nicotine dependence, unspecified, uncomplicated: Secondary | ICD-10-CM

## 2023-07-09 DIAGNOSIS — E782 Mixed hyperlipidemia: Secondary | ICD-10-CM

## 2023-07-09 DIAGNOSIS — F1021 Alcohol dependence, in remission: Secondary | ICD-10-CM

## 2023-07-09 DIAGNOSIS — S069X0S Unspecified intracranial injury without loss of consciousness, sequela: Secondary | ICD-10-CM

## 2023-07-09 DIAGNOSIS — Z8249 Family history of ischemic heart disease and other diseases of the circulatory system: Secondary | ICD-10-CM

## 2023-07-09 DIAGNOSIS — R7309 Other abnormal glucose: Secondary | ICD-10-CM | POA: Diagnosis not present

## 2023-07-09 DIAGNOSIS — Z79899 Other long term (current) drug therapy: Secondary | ICD-10-CM

## 2023-07-09 DIAGNOSIS — Z125 Encounter for screening for malignant neoplasm of prostate: Secondary | ICD-10-CM

## 2023-07-10 LAB — LIPID PANEL
Cholesterol: 183 mg/dL (ref <200)
HDL: 61 mg/dL (ref >=40)
LDL Cholesterol (Calc): 82 mg/dL
Non-HDL Cholesterol (Calc): 122 mg/dL (ref <130)
Total CHOL/HDL Ratio: 3 (calc) (ref <5.0)
Triglycerides: 335 mg/dL — ABNORMAL HIGH (ref <150)

## 2023-07-10 LAB — CBC WITH DIFFERENTIAL/PLATELET
Absolute Lymphocytes: 1628 {cells}/uL (ref 850–3900)
Absolute Monocytes: 377 {cells}/uL (ref 200–950)
Basophils Absolute: 78 {cells}/uL (ref 0–200)
Basophils Relative: 1.7 %
Eosinophils Absolute: 92 {cells}/uL (ref 15–500)
Eosinophils Relative: 2 %
HCT: 48.3 % (ref 38.5–50.0)
Hemoglobin: 16.6 g/dL (ref 13.2–17.1)
MCH: 32 pg (ref 27.0–33.0)
MCHC: 34.4 g/dL (ref 32.0–36.0)
MCV: 93.1 fL (ref 80.0–100.0)
MPV: 10.7 fL (ref 7.5–12.5)
Monocytes Relative: 8.2 %
Neutro Abs: 2424 {cells}/uL (ref 1500–7800)
Neutrophils Relative %: 52.7 %
Platelets: 232 10*3/uL (ref 140–400)
RBC: 5.19 10*6/uL (ref 4.20–5.80)
RDW: 12.6 % (ref 11.0–15.0)
Total Lymphocyte: 35.4 %
WBC: 4.6 10*3/uL (ref 3.8–10.8)

## 2023-07-10 LAB — HEMOGLOBIN A1C
Hgb A1c MFr Bld: 5.1 %{Hb} (ref <5.7)
Mean Plasma Glucose: 100 mg/dL
eAG (mmol/L): 5.5 mmol/L

## 2023-07-10 LAB — COMPLETE METABOLIC PANEL WITH GFR
AG Ratio: 1.8 (calc) (ref 1.0–2.5)
ALT: 17 U/L (ref 9–46)
AST: 25 U/L (ref 10–40)
Albumin: 4.6 g/dL (ref 3.6–5.1)
Alkaline phosphatase (APISO): 53 U/L (ref 36–130)
BUN: 9 mg/dL (ref 7–25)
CO2: 30 mmol/L (ref 20–32)
Calcium: 9.6 mg/dL (ref 8.6–10.3)
Chloride: 102 mmol/L (ref 98–110)
Creat: 1.02 mg/dL (ref 0.60–1.29)
Globulin: 2.6 g/dL (ref 1.9–3.7)
Glucose, Bld: 84 mg/dL (ref 65–99)
Potassium: 4.2 mmol/L (ref 3.5–5.3)
Sodium: 141 mmol/L (ref 135–146)
Total Bilirubin: 0.4 mg/dL (ref 0.2–1.2)
Total Protein: 7.2 g/dL (ref 6.1–8.1)
eGFR: 95 mL/min/{1.73_m2} (ref >=60)

## 2023-07-10 LAB — MICROSCOPIC MESSAGE

## 2023-07-10 LAB — URINALYSIS, ROUTINE W REFLEX MICROSCOPIC
Bacteria, UA: NONE SEEN /[HPF]
Bilirubin Urine: NEGATIVE
Glucose, UA: NEGATIVE
Hgb urine dipstick: NEGATIVE
Hyaline Cast: NONE SEEN /[LPF]
Ketones, ur: NEGATIVE
Leukocytes,Ua: NEGATIVE
Nitrite: NEGATIVE
RBC / HPF: NONE SEEN /[HPF] (ref 0–2)
Specific Gravity, Urine: 1.019 (ref 1.001–1.035)
Squamous Epithelial / HPF: NONE SEEN /[HPF] (ref <=5)
WBC, UA: NONE SEEN /[HPF] (ref 0–5)
pH: 7 (ref 5.0–8.0)

## 2023-07-10 LAB — TSH: TSH: 2.68 m[IU]/L (ref 0.40–4.50)

## 2023-07-10 LAB — INSULIN, RANDOM: Insulin: 7.8 u[IU]/mL

## 2023-07-10 LAB — MICROALBUMIN / CREATININE URINE RATIO
Creatinine, Urine: 153 mg/dL (ref 20–320)
Microalb Creat Ratio: 9 mg/g{creat} (ref <30)
Microalb, Ur: 1.4 mg/dL

## 2023-07-10 LAB — PSA: PSA: 0.61 ng/mL (ref <=4.00)

## 2023-07-10 LAB — MAGNESIUM: Magnesium: 1.6 mg/dL (ref 1.5–2.5)

## 2023-07-10 LAB — VITAMIN D 25 HYDROXY (VIT D DEFICIENCY, FRACTURES): Vit D, 25-Hydroxy: 36 ng/mL (ref 30–100)

## 2023-07-10 NOTE — Progress Notes (Signed)
[] [] [] [] [] [] [] [] [] [] [] [] [] [] [] [] [] [] [] [] [] [] [] [] [] [] [] [] [] [] [] [] [] [] [] [] [] [] [] [] [] ][] [] [] [] [] [] [] [] [] [] [] [] [] [] [] [] [] [] [] [] [] [] [[] [] [] [] []   -Test results slightly outside the reference range are not unusual. If there is anything important, I will review this with you,  otherwise it is considered normal test values.  If you have further questions,  please do not hesitate to contact me at the office or via My Chart.   [] [] [] [] [] [] [] [] [] [] [] [] [] [] [] [] [] [] [] [] [] [] [] [] [] [] [] [] [] [] [] [] [] [] [] [] [] [] [] [] [] ][] [] [] [] [] [] [] [] [] [] [] [] [] [] [] [] [] [] [] [] [] [] [[] [] [] [] []   -  Chol = 183  Excellent   - Very low risk for Heart Attack  / Stroke  [] [] [] [] [] [] [] [] [] [] [] [] [] [] [] [] [] [] [] [] [] [] [] [] [] [] [] [] [] [] [] [] [] [] [] [] [] [] [] [] [] ][] [] [] [] [] [] [] [] [] [] [] [] [] [] [] [] [] [] [] [] [] [] [[] [] [] [] []   - But Triglycerides ( 335 ) or fats in blood are too high                 (   Ideal or  Goal is less than 150  !  )    - Recommend avoid fried & greasy foods,  sweets / candy,   - Avoid white rice  (brown or wild rice or Quinoa is OK),   - Avoid white potatoes  (sweet potatoes are OK)   - Avoid anything made from white flour  - bagels, doughnuts, rolls, buns, biscuits, white and   wheat breads, pizza crust and traditional  pasta made of white flour & egg white  - (vegetarian pasta or spinach or wheat pasta is OK).    - Multi-grain bread is OK - like multi-grain flat bread or  sandwich thins.   - Avoid alcohol in excess.   - Exercise is also important.  [] [] [] [] [] [] [] [] [] [] [] [] [] [] [] [] [] [] [] [] [] [] [] [] [] [] [] [] [] [] [] [] [] [] [] [] [] [] [] [] [] ][] [] [] [] [] [] [] [] [] [] [] [] [] [] [] [] [] [] [] [] [] [] [[] [] [] [] []   -  PSA - Low - No sign of Prostate Caner  -   Great  !  [] [] [] [] [] [] [] [] [] [] [] [] [] [] [] [] [] [] [] [] [] [] [] [] [] [] [] [] [] [] [] [] [] [] [] [] [] [] [] [] [] ][] [] [] [] [] [] [] [] [] [] [] [] [] [] [] [] [] [] [] [] [] [] [[] [] [] [] []    -  Vitamin   D has dropped down to very low level of 36    -  Vitamin D goal is between 70-100.   - Please make sure that you  are taking your Vitamin D as directed.   - It is very important as a natural anti-inflammatory and helping the                          immune system protect against viral infections, like Flu  & the Covid    - Also helps hair, skin, and nails, as well as reducing stroke and heart attack risk.   - It helps your bones  &  and helps with mood.  - It also decreases numerous cancer risks, so please                                                                                           take it as directed.   - Low Vit D is associated with a 200-300% higher risk for CANCER   and 200-300% higher risk for HEART   ATTACK  &  STROKE.    - It is also associated with higher death rate at younger ages,   autoimmune diseases like Rheumatoid arthritis, Lupus, Multiple Sclerosis.     - Also many other serious conditions, like depression, Alzheimer's Dementia  muscle aches, fatigue, fibromyalgia   - Recommend Start taking Vitamin 5,000 unit  capsule / Daily                                                                                                                                         [] [] [] [] [] [] [] [] [] [] [] [] [] [] [] [] [] [] [] [] [] [] [] [] [] [] [] [] [] [] [] [] [] [] [] [] [] [] [] [] [] ][] [] [] [] [] [] [] [] [] [] [] [] [] [] [] [] [] [] [] [] [] [] [[] [] [] [] []   [] [] [] [] [] [] [] [] [] [] [] [] [] [] [] [] [] [] [] [] [] [] [] [] [] [] [] [] [] [] [] [] [] [] [] [] [] [] [] [] [] ][] [] [] [] [] [] [] [] [] [] [] [] [] [] [] [] [] [] [] [] [] [] [[] [] [] [] []    -     [] [] [] [] [] [] [] [] [] [] [] [] [] [] [] [] [] [] [] [] [] [] [] [] [] [] [] [] [] [] [] [] [] [] [] [] [] [] [] [] [] ][] [] [] [] [] [] [] [] [] [] [] [] [] [] [] [] [] [] [] [] [] [] [[] [] [] [] []

## 2023-11-06 ENCOUNTER — Ambulatory Visit: Payer: Medicare Other | Admitting: Nurse Practitioner

## 2023-11-19 ENCOUNTER — Ambulatory Visit (INDEPENDENT_AMBULATORY_CARE_PROVIDER_SITE_OTHER): Payer: Medicare Other | Admitting: Family Medicine

## 2023-11-19 ENCOUNTER — Encounter: Payer: Self-pay | Admitting: Family Medicine

## 2023-11-19 VITALS — BP 128/90 | HR 80 | Temp 98.4°F | Ht 67.0 in | Wt 168.9 lb

## 2023-11-19 DIAGNOSIS — F5104 Psychophysiologic insomnia: Secondary | ICD-10-CM | POA: Insufficient documentation

## 2023-11-19 DIAGNOSIS — E782 Mixed hyperlipidemia: Secondary | ICD-10-CM | POA: Diagnosis not present

## 2023-11-19 DIAGNOSIS — I10 Essential (primary) hypertension: Secondary | ICD-10-CM

## 2023-11-19 MED ORDER — TRAZODONE HCL 150 MG PO TABS
ORAL_TABLET | ORAL | 3 refills | Status: AC
Start: 2023-11-19 — End: ?

## 2023-11-19 NOTE — Assessment & Plan Note (Signed)
 Diastolic is elevated today but systolic is in the normal range. He is not currently on any medications, will continue to monitor BP at each visit. Pt denies any chest pain, SOB or dizziness.
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
## 2023-11-19 NOTE — Assessment & Plan Note (Signed)
 Elevated TG level on cholesterol panel from December 2024. I counseled the patient on this and advised that he reduce sugar and starches in his diet. Will repeat numbres yearly.

## 2023-11-19 NOTE — Assessment & Plan Note (Signed)
 Secondary to TBI in 2005, on trazodone  150 mg at bedtime, I will refill this medication for him and continue as prescribed.

## 2024-03-11 ENCOUNTER — Ambulatory Visit: Payer: Medicare Other | Admitting: Internal Medicine

## 2024-05-16 ENCOUNTER — Encounter: Payer: Medicare Other | Admitting: Internal Medicine

## 2024-06-12 ENCOUNTER — Encounter: Payer: Medicare Other | Admitting: Internal Medicine

## 2024-07-21 ENCOUNTER — Ambulatory Visit: Admitting: Family Medicine

## 2024-07-28 ENCOUNTER — Ambulatory Visit (INDEPENDENT_AMBULATORY_CARE_PROVIDER_SITE_OTHER): Admitting: Family Medicine

## 2024-07-28 ENCOUNTER — Encounter: Payer: Self-pay | Admitting: Family Medicine

## 2024-07-28 VITALS — BP 150/105 | HR 85 | Temp 98.0°F | Ht 67.0 in | Wt 170.8 lb

## 2024-07-28 DIAGNOSIS — Z0000 Encounter for general adult medical examination without abnormal findings: Secondary | ICD-10-CM

## 2024-07-28 DIAGNOSIS — E559 Vitamin D deficiency, unspecified: Secondary | ICD-10-CM

## 2024-07-28 DIAGNOSIS — R7303 Prediabetes: Secondary | ICD-10-CM | POA: Diagnosis not present

## 2024-07-28 DIAGNOSIS — I10 Essential (primary) hypertension: Secondary | ICD-10-CM | POA: Diagnosis not present

## 2024-07-28 DIAGNOSIS — Z23 Encounter for immunization: Secondary | ICD-10-CM | POA: Diagnosis not present

## 2024-07-28 DIAGNOSIS — E782 Mixed hyperlipidemia: Secondary | ICD-10-CM
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
## 2024-07-28 NOTE — Patient Instructions (Addendum)
 Try taking Magnesium glycinate 200 mg every day at bedtime. This will help lower blood pressure,  Reduce salt intake  Reduce regular soda/ caffeine  Start checking blood pressure at home  -- if it continues to be elevated please send me a message -- we might need to start medication if it is consistently high.

## 2024-07-29 LAB — LIPID PANEL
Cholesterol: 182 mg/dL (ref 28–200)
HDL: 61.2 mg/dL
LDL Cholesterol: 82 mg/dL (ref 10–99)
NonHDL: 120.38
Total CHOL/HDL Ratio: 3
Triglycerides: 194 mg/dL — ABNORMAL HIGH (ref 10.0–149.0)
VLDL: 38.8 mg/dL (ref 0.0–40.0)

## 2024-07-29 LAB — COMPREHENSIVE METABOLIC PANEL WITH GFR
ALT: 27 U/L (ref 3–53)
AST: 38 U/L — ABNORMAL HIGH (ref 5–37)
Albumin: 4.6 g/dL (ref 3.5–5.2)
Alkaline Phosphatase: 49 U/L (ref 39–117)
BUN: 10 mg/dL (ref 6–23)
CO2: 30 meq/L (ref 19–32)
Calcium: 9.9 mg/dL (ref 8.4–10.5)
Chloride: 102 meq/L (ref 96–112)
Creatinine, Ser: 1.09 mg/dL (ref 0.40–1.50)
GFR: 83.53 mL/min
Glucose, Bld: 86 mg/dL (ref 70–99)
Potassium: 4.5 meq/L (ref 3.5–5.1)
Sodium: 139 meq/L (ref 135–145)
Total Bilirubin: 0.5 mg/dL (ref 0.2–1.2)
Total Protein: 7.3 g/dL (ref 6.0–8.3)

## 2024-07-29 LAB — HEMOGLOBIN A1C: Hgb A1c MFr Bld: 5.3 % (ref 4.6–6.5)

## 2024-07-29 LAB — VITAMIN D 25 HYDROXY (VIT D DEFICIENCY, FRACTURES): VITD: 37.34 ng/mL (ref 30.00–100.00)

## 2024-07-31 ENCOUNTER — Encounter: Payer: Medicare Other | Admitting: Internal Medicine

## 2024-07-31 ENCOUNTER — Ambulatory Visit: Payer: Self-pay | Admitting: Family Medicine
